# Patient Record
Sex: Male | Born: 1995 | Race: White | Hispanic: No | Marital: Single | Smoking: Never smoker
Health system: Southern US, Community
[De-identification: ages and names within clinical notes are randomized; demographics above are authoritative.]

## PROBLEM LIST (undated history)

## (undated) DIAGNOSIS — G43909 Migraine, unspecified, not intractable, without status migrainosus: Secondary | ICD-10-CM

## (undated) HISTORY — DX: Migraine, unspecified, not intractable, without status migrainosus: G43.909

## (undated) HISTORY — PX: NO PAST SURGERIES: SHX2092

---
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
## 2018-01-23 NOTE — Progress Notes (Signed)
[facility]    Provider:  Dr Lucia GaskinsAhern Referring Provider: Lindaann PascalLong, Scott, PA-C Primary Care Physician:  Lindaann PascalLong, Scott, PA-C  CC:  Migraines and headaches  HPI:  Travis Morales is a 22 y.o. male here as a referral from Dr. Jacqulyn BathLong for migraines. He has had headaches since middle school. He was taking meds for ADD, had problems with "emotions". Migraines worsened in HS. In the past 2 years they have been constant, "really really bad".  He has a lot of nose bleeds and this corresponds to the migraines and headaches. He does not take OTC medications, a BC powder every once in a while. Migraines are daily, he wakes up every morning with a headache (does not wake him up) behind the eyes with severe pain and on top of the head. Pulsating. When occipital he will have a nose bleed. Usually occipital and he wakes with a headache. Mother had migraines. Headaches last all day long untreated. BC can help but he doesn't take that often maybe one every 2-3 weeks. They are pulsating, pounding, throbbing, unilateral but can be either side, he has sound and light sensitivity, nausea, no vomiting. Can be severe 10/10 pain. Sleep helps, Worse in the morning. He hydrates well, no alcohol (triggers migraines), sleep helps. H barely sleep because of headache, nose bleeds. He says his home is not dry.  No other focal neurologic deficits, associated symptoms, inciting events or modifiable factors.   Reviewed notes, labs and imaging from outside physicians, which showed:   Reviewed labs January 18, 2018: INR is 1, CBC unremarkable, CMP unremarkable with BUN 14 and creatinine 0.95.  Reviewed urgent care notes.  Patient reported frequent severe headaches and migraines but no loss of consciousness, no weakness no numbness no dizziness.  No fever no night sweats, no meningismus.  Exam including neurologic and musculoskeletal were normal.  He was diagnosed with migraine without aura, not intractable, without  status migrainosus.  He also reported epistaxis and labs were drawn.  Review of Systems: Patient complains of symptoms per HPI as well as the following symptoms: headache, snoring. Pertinent negatives and positives per HPI. All others negative.   Social History   Socioeconomic History  . Marital status: Single    Spouse name: Not on file  . Number of children: Not on file  . Years of education: Not on file  . Highest education level: Not on file  Social Needs  . Financial resource strain: Not on file  . Food insecurity - worry: Not on file  . Food insecurity - inability: Not on file  . Transportation needs - medical: Not on file  . Transportation needs - non-medical: Not on file  Occupational History  . Not on file  Tobacco Use  . Smoking status: Never Smoker  . Smokeless tobacco: Never Used  Substance and Sexual Activity  . Alcohol use: No    Frequency: Never  . Drug use: No  . Sexual activity: Not on file  Other Topics Concern  . Not on file  Social History Narrative  . Not on file    Family History  Problem Relation Age of Onset  . Migraines Mother     Past Medical History:  Diagnosis Date  . Migraines     Past Surgical History:  Procedure Laterality Date  . NO PAST SURGERIES      Current Outpatient Medications  Medication Sig Dispense Refill  . nortriptyline (PAMELOR) 25 MG capsule Take 1 capsule (25 mg total) by  mouth at bedtime. 30 capsule 3   No current facility-administered medications for this visit.     Allergies as of 01/24/2018  . (No Known Allergies)    Vitals: BP 118/71   Pulse 88   Ht 5\' 9"  (1.753 m)   Wt 131 lb (59.4 kg)   BMI 19.35 kg/m  Last Weight:  Wt Readings from Last 1 Encounters:  01/24/18 131 lb (59.4 kg)   Last Height:   Ht Readings from Last 1 Encounters:  01/24/18 5\' 9"  (1.753 m)         Assessment/Plan:  22 year old with daily intractable headache. May be migraine disorder but need to rule out other causes  given his symptoms (as below).    Recurrent nose bleeds: ENT Has concerning symptoms of positional occipital headache, daily intractable headache needs MRI brain w/wo contrast to evaluate for space-occupying mass, lesion, tumo, chiari, pseudotumor cerebri or any other intracranial etiology Labs today Will consider Aimovig Start Nortriptyline for migraine prevention, may also help for insomnia  Cc: Scott Long  Orders Placed This Encounter  Procedures  . MR BRAIN W WO CONTRAST  . Comprehensive metabolic panel  . CBC  . TSH  . Ambulatory referral to ENT   Discussed: To prevent or relieve headaches, try the following: Cool Compress. Lie down and place a cool compress on your head.  Avoid headache triggers. If certain foods or odors seem to have triggered your migraines in the past, avoid them. A headache diary might help you identify triggers.  Include physical activity in your daily routine. Try a daily walk or other moderate aerobic exercise.  Manage stress. Find healthy ways to cope with the stressors, such as delegating tasks on your to-do list.  Practice relaxation techniques. Try deep breathing, yoga, massage and visualization.  Eat regularly. Eating regularly scheduled meals and maintaining a healthy diet might help prevent headaches. Also, drink plenty of fluids.  Follow a regular sleep schedule. Sleep deprivation might contribute to headaches Consider biofeedback. With this mind-body technique, you learn to control certain bodily functions - such as muscle tension, heart rate and blood pressure - to prevent headaches or reduce headache pain.    Proceed to emergency room if you experience new or worsening symptoms or symptoms do not resolve, if you have new neurologic symptoms or if headache is severe, or for any concerning symptom.   Provided education and documentation from American headache Society toolbox including articles on: chronic migraine medication overuse headache,  chronic migraines, prevention of migraines, behavioral and other nonpharmacologic treatments for headache.  Naomie Dean, MD  [facility] [address] [place], [place] [address]  Phone [phone] Fax [phone]

## 2018-01-24 ENCOUNTER — Ambulatory Visit: Payer: 59 | Admitting: Neurology

## 2018-01-24 ENCOUNTER — Encounter: Payer: Self-pay | Admitting: Neurology

## 2018-01-24 VITALS — BP 118/71 | HR 88 | Ht 69.0 in | Wt 131.0 lb

## 2018-01-24 DIAGNOSIS — R51 Headache with orthostatic component, not elsewhere classified: Secondary | ICD-10-CM

## 2018-01-24 DIAGNOSIS — G8929 Other chronic pain: Secondary | ICD-10-CM

## 2018-01-24 DIAGNOSIS — G43709 Chronic migraine without aura, not intractable, without status migrainosus: Secondary | ICD-10-CM

## 2018-01-24 DIAGNOSIS — R040 Epistaxis: Secondary | ICD-10-CM

## 2018-01-24 DIAGNOSIS — G43711 Chronic migraine without aura, intractable, with status migrainosus: Secondary | ICD-10-CM

## 2018-01-24 DIAGNOSIS — R519 Headache, unspecified: Secondary | ICD-10-CM

## 2018-01-24 MED ORDER — NORTRIPTYLINE HCL 25 MG PO CAPS: 25.0000 mg | ORAL_CAPSULE | Freq: Every day | ORAL | 3 refills | Status: DC

## 2018-01-24 NOTE — Patient Instructions (Addendum)
Recurrent nose bleeds: ENT (dr. Haroldine Lawsrossley) MRI brain w/wo contrast (will schedule) Labs today Will consider Aimovig - please research these new medications Start Nortriptyline at bedtime  Nortriptyline capsules What is this medicine? NORTRIPTYLINE (nor TRIP ti leen) is used to treat depression. This medicine may be used for other purposes; ask your health care provider or pharmacist if you have questions. COMMON BRAND NAME(S): Aventyl, Pamelor What should I tell my health care provider before I take this medicine? They need to know if you have any of these conditions: -an alcohol problem -bipolar disorder or schizophrenia -difficulty passing urine, prostate trouble -glaucoma -heart disease or recent heart attack -liver disease -over active thyroid -seizures -thoughts or plans of suicide or a previous suicide attempt or family history of suicide attempt -an unusual or allergic reaction to nortriptyline, other medicines, foods, dyes, or preservatives -pregnant or trying to get pregnant -breast-feeding How should I use this medicine? Take this medicine by mouth with a glass of water. Follow the directions on the prescription label. Take your doses at regular intervals. Do not take it more often than directed. Do not stop taking this medicine suddenly except upon the advice of your doctor. Stopping this medicine too quickly may cause serious side effects or your condition may worsen. A special MedGuide will be given to you by the pharmacist with each prescription and refill. Be sure to read this information carefully each time. Talk to your pediatrician regarding the use of this medicine in children. Special care may be needed. Overdosage: If you think you have taken too much of this medicine contact a poison control center or emergency room at once. NOTE: This medicine is only for you. Do not share this medicine with others. What if I miss a dose? If you miss a dose, take it as soon as  you can. If it is almost time for your next dose, take only that dose. Do not take double or extra doses. What may interact with this medicine? Do not take this medicine with any of the following medications: -arsenic trioxide -certain medicines medicines for irregular heart beat -cisapride -halofantrine -linezolid -MAOIs like Carbex, Eldepryl, Marplan, Nardil, and Parnate -methylene blue (injected into a vein) -other medicines for mental depression -phenothiazines like perphenazine, thioridazine and chlorpromazine -pimozide -probucol -procarbazine -sparfloxacin -St. John's Wort -ziprasidone This medicine may also interact with any of the following medications: -atropine and related drugs like hyoscyamine, scopolamine, tolterodine and others -barbiturate medicines for inducing sleep or treating seizures, such as phenobarbital -cimetidine -medicines for diabetes -medicines for seizures like carbamazepine or phenytoin -reserpine -thyroid medicine This list may not describe all possible interactions. Give your health care provider a list of all the medicines, herbs, non-prescription drugs, or dietary supplements you use. Also tell them if you smoke, drink alcohol, or use illegal drugs. Some items may interact with your medicine. What should I watch for while using this medicine? Tell your doctor if your symptoms do not get better or if they get worse. Visit your doctor or health care professional for regular checks on your progress. Because it may take several weeks to see the full effects of this medicine, it is important to continue your treatment as prescribed by your doctor. Patients and their families should watch out for new or worsening thoughts of suicide or depression. Also watch out for sudden changes in feelings such as feeling anxious, agitated, panicky, irritable, hostile, aggressive, impulsive, severely restless, overly excited and hyperactive, or not being able to sleep. If  this happens, especially at the beginning of treatment or after a change in dose, call your health care professional. Bonita Quin may get drowsy or dizzy. Do not drive, use machinery, or do anything that needs mental alertness until you know how this medicine affects you. Do not stand or sit up quickly, especially if you are an older patient. This reduces the risk of dizzy or fainting spells. Alcohol may interfere with the effect of this medicine. Avoid alcoholic drinks. Do not treat yourself for coughs, colds, or allergies without asking your doctor or health care professional for advice. Some ingredients can increase possible side effects. Your mouth may get dry. Chewing sugarless gum or sucking hard candy, and drinking plenty of water may help. Contact your doctor if the problem does not go away or is severe. This medicine may cause dry eyes and blurred vision. If you wear contact lenses you may feel some discomfort. Lubricating drops may help. See your eye doctor if the problem does not go away or is severe. This medicine can cause constipation. Try to have a bowel movement at least every 2 to 3 days. If you do not have a bowel movement for 3 days, call your doctor or health care professional. This medicine can make you more sensitive to the sun. Keep out of the sun. If you cannot avoid being in the sun, wear protective clothing and use sunscreen. Do not use sun lamps or tanning beds/booths. What side effects may I notice from receiving this medicine? Side effects that you should report to your doctor or health care professional as soon as possible: -allergic reactions like skin rash, itching or hives, swelling of the face, lips, or tongue -anxious -breathing problems -changes in vision -confusion -elevated mood, decreased need for sleep, racing thoughts, impulsive behavior -eye pain -fast, irregular heartbeat -feeling faint or lightheaded, falls -feeling agitated, angry, or irritable -fever with  increased sweating -hallucination, loss of contact with reality -seizures -stiff muscles -suicidal thoughts or other mood changes -tingling, pain, or numbness in the feet or hands -trouble passing urine or change in the amount of urine -trouble sleeping -unusually weak or tired -vomiting -yellowing of the eyes or skin Side effects that usually do not require medical attention (report to your doctor or health care professional if they continue or are bothersome): -change in sex drive or performance -change in appetite or weight -constipation -dizziness -dry mouth -nausea -tired -tremors -upset stomach This list may not describe all possible side effects. Call your doctor for medical advice about side effects. You may report side effects to FDA at 1-800-FDA-1088. Where should I keep my medicine? Keep out of the reach of children. Store at room temperature between 15 and 30 degrees C (59 and 86 degrees F). Keep container tightly closed. Throw away any unused medicine after the expiration date. NOTE: This sheet is a summary. It may not cover all possible information. If you have questions about this medicine, talk to your doctor, pharmacist, or health care provider.  2018 Elsevier/Gold Standard (2016-04-20 12:53:08)  Beckie Busing: Patient drug information Access [employer] Online here. Copyright [phone] [employer] All rights reserved. (For additional information see "Erenumab: Drug information") Brand Names: [place]  Aimovig;  Aimovig 140 Dose  What is this drug used for?   It is used to prevent migraine headaches.  What do I need to tell my doctor BEFORE I take this drug?   If you have an allergy to this drug or any part of this drug.  If you are allergic to any drugs like this one, any other drugs, foods, or other substances. Tell your doctor about the allergy and what signs you had, like rash; hives; itching; shortness of breath; wheezing; cough; swelling of face, lips, tongue,  or throat; or any other signs.   This drug may interact with other drugs or health problems.   Tell your doctor and pharmacist about all of your drugs (prescription or OTC, natural products, vitamins) and health problems. You must check to make sure that it is safe for you to take this drug with all of your drugs and health problems. Do not start, stop, or change the dose of any drug without checking with your doctor.  What are some things I need to know or do while I take this drug?   Tell all of your health care providers that you take this drug. This includes your doctors, nurses, pharmacists, and dentists.   If you have a latex allergy, talk with your doctor.   Tell your doctor if you are pregnant or plan on getting pregnant. You will need to talk about the benefits and risks of using this drug while you are pregnant.   Tell your doctor if you are breast-feeding. You will need to talk about any risks to your baby.  What are some side effects that I need to call my doctor about right away?   WARNING/CAUTION: Even though it may be rare, some people may have very bad and sometimes deadly side effects when taking a drug. Tell your doctor or get medical help right away if you have any of the following signs or symptoms that may be related to a very bad side effect:   Signs of an allergic reaction, like rash; hives; itching; red, swollen, blistered, or peeling skin with or without fever; wheezing; tightness in the chest or throat; trouble breathing, swallowing, or talking; unusual hoarseness; or swelling of the mouth, face, lips, tongue, or throat.  What are some other side effects of this drug?   All drugs may cause side effects. However, many people have no side effects or only have minor side effects. Call your doctor or get medical help if any of these side effects or any other side effects bother you or do not go away:   Redness or swelling where the shot is given.   Pain where the shot was  given.   Constipation.   These are not all of the side effects that may occur. If you have questions about side effects, call your doctor. Call your doctor for medical advice about side effects.   You may report side effects to your national health agency.  How is this drug best taken?   Use this drug as ordered by your doctor. Read all information given to you. Follow all instructions closely.   It is given as a shot into the fatty part of the skin on the top of the thigh, belly area, or upper arm.   If you will be giving yourself the shot, your doctor or nurse will teach you how to give the shot.   Follow how to use as you have been told by the doctor or read the package insert.   If stored in a refrigerator, let this drug come to room temperature before using it. Leave it at room temperature for at least 30 minutes. Do not heat this drug.   Protect from heat and sunlight.   Do not shake.  Do not give into skin that is irritated, bruised, red, infected, or scarred.   Do not use if the solution is cloudy, leaking, or has particles.   Do not use if solution changes color.   Throw away after using. Do not use the device more than 1 time.   Throw away needles in a needle/sharp disposal box. Do not reuse needles or other items. When the box is full, follow all local rules for getting rid of it. Talk with a doctor or pharmacist if you have any questions.  What do I do if I miss a dose?   Take a missed dose as soon as you think about it.   After taking a missed dose, start a new schedule based on when the dose is taken.  How do I store and/or throw out this drug?   Store in a refrigerator. Do not freeze.   Store in the carton to protect from light.   Do not use if it has been frozen.   If you drop this drug on a hard surface, do not use it.   If needed, you may store at room temperature for up to 7 days. Write down the date you take this drug out of the refrigerator. If stored at room  temperature and not used within 7 days, throw this drug away.   Do not put this drug back in the refrigerator after it has been stored at room temperature.   Keep all drugs in a safe place. Keep all drugs out of the reach of children and pets.   Throw away unused or expired drugs. Do not flush down a toilet or pour down a drain unless you are told to do so. Check with your pharmacist if you have questions about the best way to throw out drugs. There may be drug take-back programs in your area.  General drug facts   If your symptoms or health problems do not get better or if they become worse, call your doctor.   Do not share your drugs with others and do not take anyone else's drugs.   Keep a list of all your drugs (prescription, natural products, vitamins, OTC) with you. Give this list to your doctor.   Talk with the doctor before starting any new drug, including prescription or OTC, natural products, or vitamins.   Some drugs may have another patient information leaflet. If you have any questions about this drug, please talk with your doctor, nurse, pharmacist, or other health care provider.   If you think there has been an overdose, call your poison control center or get medical care right away. Be ready to tell or show what was taken, how much, and when it happened.

## 2018-01-25 LAB — COMPREHENSIVE METABOLIC PANEL
ALT: 23 IU/L (ref 0–44)
AST: 29 IU/L (ref 0–40)
Albumin/Globulin Ratio: 2.5 — ABNORMAL HIGH (ref 1.2–2.2)
Albumin: 5 g/dL (ref 3.5–5.5)
Alkaline Phosphatase: 73 IU/L (ref 39–117)
BUN/Creatinine Ratio: 9 (ref 9–20)
BUN: 9 mg/dL (ref 6–20)
Bilirubin Total: 0.6 mg/dL (ref 0.0–1.2)
CO2: 22 mmol/L (ref 20–29)
Calcium: 9.8 mg/dL (ref 8.7–10.2)
Chloride: 102 mmol/L (ref 96–106)
Creatinine, Ser: 0.98 mg/dL (ref 0.76–1.27)
GFR calc Af Amer: 124 mL/min/{1.73_m2} (ref >59)
GFR calc non Af Amer: 107 mL/min/{1.73_m2} (ref >59)
Globulin, Total: 2 g/dL (ref 1.5–4.5)
Glucose: 89 mg/dL (ref 65–99)
Potassium: 4.8 mmol/L (ref 3.5–5.2)
Sodium: 139 mmol/L (ref 134–144)
Total Protein: 7 g/dL (ref 6.0–8.5)

## 2018-01-25 LAB — TSH: TSH: 2.27 u[IU]/mL (ref 0.450–4.500)

## 2018-01-25 LAB — CBC
Hematocrit: 44.9 % (ref 37.5–51.0)
Hemoglobin: 15.1 g/dL (ref 13.0–17.7)
MCH: 29 pg (ref 26.6–33.0)
MCHC: 33.6 g/dL (ref 31.5–35.7)
MCV: 86 fL (ref 79–97)
Platelets: 234 10*3/uL (ref 150–379)
RBC: 5.21 x10E6/uL (ref 4.14–5.80)
RDW: 14.3 % (ref 12.3–15.4)
WBC: 4.3 10*3/uL (ref 3.4–10.8)

## 2018-01-27 ENCOUNTER — Encounter: Payer: Self-pay | Admitting: Neurology

## 2018-01-27 ENCOUNTER — Telehealth: Payer: Self-pay | Admitting: *Deleted

## 2018-01-27 ENCOUNTER — Telehealth: Payer: Self-pay | Admitting: Neurology

## 2018-01-27 DIAGNOSIS — G43709 Chronic migraine without aura, not intractable, without status migrainosus: Secondary | ICD-10-CM | POA: Insufficient documentation

## 2018-01-27 NOTE — Telephone Encounter (Addendum)
Called pt & LVM (ok per DPR) informing him that his labs look fine per Dr. Lucia GaskinsAhern. Stated that call back is not required but encouraged pt to call with any questions. Left office number & hours in message.   ----- Message from Anson FretAntonia B Ahern, MD sent at 01/25/2018  2:13 PM EST ----- Labs look fine thanks

## 2018-01-27 NOTE — Telephone Encounter (Signed)
Patient calling to cancel his appointment for an MRI for tomorrow and does not want to reschedule. He can be reached at 3:50pm today.if you need to call him.

## 2018-01-27 NOTE — Telephone Encounter (Signed)
Noted, I went ahead and cancel his appointment.

## 2018-01-28 ENCOUNTER — Other Ambulatory Visit: Payer: 59

## 2018-03-19 ENCOUNTER — Ambulatory Visit: Payer: 59 | Admitting: Neurology

## 2018-09-13 ENCOUNTER — Emergency Department (HOSPITAL_COMMUNITY): Payer: 59

## 2018-09-13 ENCOUNTER — Other Ambulatory Visit: Payer: Self-pay

## 2018-09-13 ENCOUNTER — Encounter (HOSPITAL_COMMUNITY): Payer: Self-pay | Admitting: Emergency Medicine

## 2018-09-13 ENCOUNTER — Inpatient Hospital Stay (HOSPITAL_COMMUNITY)
Admission: EM | Admit: 2018-09-13 | Discharge: 2018-09-19 | DRG: 165 | Disposition: A | Discharge status: Home / Self Care | Payer: 59 | Attending: Cardiothoracic Surgery | Admitting: Cardiothoracic Surgery

## 2018-09-13 ENCOUNTER — Inpatient Hospital Stay (HOSPITAL_COMMUNITY): Payer: 59

## 2018-09-13 DIAGNOSIS — R0603 Acute respiratory distress: Secondary | ICD-10-CM | POA: Diagnosis present

## 2018-09-13 DIAGNOSIS — J939 Pneumothorax, unspecified: Secondary | ICD-10-CM | POA: Diagnosis not present

## 2018-09-13 DIAGNOSIS — J9382 Other air leak: Secondary | ICD-10-CM | POA: Diagnosis present

## 2018-09-13 DIAGNOSIS — Z9889 Other specified postprocedural states: Secondary | ICD-10-CM

## 2018-09-13 DIAGNOSIS — Z4682 Encounter for fitting and adjustment of non-vascular catheter: Secondary | ICD-10-CM

## 2018-09-13 DIAGNOSIS — Z9689 Presence of other specified functional implants: Secondary | ICD-10-CM | POA: Diagnosis not present

## 2018-09-13 DIAGNOSIS — J9383 Other pneumothorax: Secondary | ICD-10-CM | POA: Diagnosis present

## 2018-09-13 DIAGNOSIS — J439 Emphysema, unspecified: Secondary | ICD-10-CM | POA: Diagnosis present

## 2018-09-13 DIAGNOSIS — Z09 Encounter for follow-up examination after completed treatment for conditions other than malignant neoplasm: Secondary | ICD-10-CM

## 2018-09-13 DIAGNOSIS — R0602 Shortness of breath: Secondary | ICD-10-CM | POA: Diagnosis present

## 2018-09-13 DIAGNOSIS — Z79899 Other long term (current) drug therapy: Secondary | ICD-10-CM | POA: Diagnosis not present

## 2018-09-13 DIAGNOSIS — J930 Spontaneous tension pneumothorax: Principal | ICD-10-CM

## 2018-09-13 DIAGNOSIS — J9311 Primary spontaneous pneumothorax: Secondary | ICD-10-CM | POA: Diagnosis not present

## 2018-09-13 DIAGNOSIS — R0781 Pleurodynia: Secondary | ICD-10-CM | POA: Diagnosis not present

## 2018-09-13 LAB — CBC WITH DIFFERENTIAL/PLATELET
Abs Immature Granulocytes: 0.01 10*3/uL (ref 0.00–0.07)
BASOS ABS: 0 10*3/uL (ref 0.0–0.1)
Basophils Relative: 1 %
EOS ABS: 0.1 10*3/uL (ref 0.0–0.5)
EOS PCT: 2 %
HEMATOCRIT: 47.3 % (ref 39.0–52.0)
HEMOGLOBIN: 15.5 g/dL (ref 13.0–17.0)
Immature Granulocytes: 0 %
LYMPHS PCT: 43 %
Lymphs Abs: 1.5 10*3/uL (ref 0.7–4.0)
MCH: 28 pg (ref 26.0–34.0)
MCHC: 32.8 g/dL (ref 30.0–36.0)
MCV: 85.5 fL (ref 80.0–100.0)
MONO ABS: 0.4 10*3/uL (ref 0.1–1.0)
Monocytes Relative: 10 %
NRBC: 0 % (ref 0.0–0.2)
Neutro Abs: 1.5 10*3/uL — ABNORMAL LOW (ref 1.7–7.7)
Neutrophils Relative %: 44 %
Platelets: 227 10*3/uL (ref 150–400)
RBC: 5.53 MIL/uL (ref 4.22–5.81)
RDW: 12.1 % (ref 11.5–15.5)
WBC: 3.5 10*3/uL — AB (ref 4.0–10.5)

## 2018-09-13 LAB — BASIC METABOLIC PANEL
Anion gap: 8 (ref 5–15)
BUN: 14 mg/dL (ref 6–20)
CALCIUM: 9.7 mg/dL (ref 8.9–10.3)
CHLORIDE: 109 mmol/L (ref 98–111)
CO2: 27 mmol/L (ref 22–32)
CREATININE: 1.13 mg/dL (ref 0.61–1.24)
GFR calc Af Amer: >60 mL/min (ref >60)
GFR calc non Af Amer: >60 mL/min (ref >60)
Glucose, Bld: 125 mg/dL — ABNORMAL HIGH (ref 70–99)
Potassium: 3.8 mmol/L (ref 3.5–5.1)
SODIUM: 144 mmol/L (ref 135–145)

## 2018-09-13 LAB — RAPID HIV SCREEN (HIV 1/2 AB+AG)
HIV 1/2 Antibodies: NONREACTIVE
HIV-1 P24 Antigen - HIV24: NONREACTIVE

## 2018-09-13 LAB — GLUCOSE, CAPILLARY: GLUCOSE-CAPILLARY: 94 mg/dL (ref 70–99)

## 2018-09-13 LAB — D-DIMER, QUANTITATIVE: D-Dimer, Quant: <0.27 ug/mL-FEU (ref 0.00–0.50)

## 2018-09-13 LAB — TROPONIN I: Troponin I: <0.03 ng/mL (ref <0.03)

## 2018-09-13 MED ORDER — ONDANSETRON HCL 4 MG/2ML IJ SOLN
4.0000 mg | Freq: Four times a day (QID) | INTRAMUSCULAR | Status: DC | PRN
  Administered 2018-09-13: 4 mg via INTRAVENOUS
  Filled 2018-09-13: qty 2

## 2018-09-13 MED ORDER — ONDANSETRON HCL 4 MG PO TABS: 4.0000 mg | ORAL_TABLET | Freq: Four times a day (QID) | ORAL | Status: DC | PRN

## 2018-09-13 MED ORDER — ACETAMINOPHEN 325 MG PO TABS
650.0000 mg | ORAL_TABLET | Freq: Four times a day (QID) | ORAL | Status: DC | PRN
  Administered 2018-09-13 – 2018-09-14 (×3): 650 mg via ORAL
  Filled 2018-09-13 (×4): qty 2

## 2018-09-13 MED ORDER — TRAMADOL HCL 50 MG PO TABS: 50.0000 mg | ORAL_TABLET | Freq: Once | ORAL | Status: DC

## 2018-09-13 MED ORDER — HEPARIN SODIUM (PORCINE) 5000 UNIT/ML IJ SOLN
5000.0000 [IU] | Freq: Three times a day (TID) | INTRAMUSCULAR | Status: DC
  Administered 2018-09-13 – 2018-09-15 (×7): 5000 [IU] via SUBCUTANEOUS
  Filled 2018-09-13 (×7): qty 1

## 2018-09-13 MED ORDER — ACETAMINOPHEN 650 MG RE SUPP: 650.0000 mg | Freq: Four times a day (QID) | RECTAL | Status: DC | PRN

## 2018-09-13 MED ORDER — SODIUM CHLORIDE 0.9% FLUSH
3.0000 mL | Freq: Two times a day (BID) | INTRAVENOUS | Status: DC
  Administered 2018-09-13 – 2018-09-16 (×6): 3 mL via INTRAVENOUS

## 2018-09-13 MED ORDER — POLYETHYLENE GLYCOL 3350 17 G PO PACK: 17.0000 g | PACK | Freq: Every day | ORAL | Status: DC | PRN

## 2018-09-13 MED ORDER — SODIUM CHLORIDE 0.9% FLUSH: 3.0000 mL | INTRAVENOUS | Status: DC | PRN

## 2018-09-13 MED ORDER — HYDROMORPHONE HCL 1 MG/ML IJ SOLN
1.0000 mg | Freq: Once | INTRAMUSCULAR | Status: AC
  Administered 2018-09-13: 1 mg via INTRAVENOUS
  Filled 2018-09-13: qty 1

## 2018-09-13 MED ORDER — SODIUM CHLORIDE 0.9 % IV SOLN: 250.0000 mL | INTRAVENOUS | Status: DC | PRN

## 2018-09-13 MED ORDER — HYDROMORPHONE HCL 1 MG/ML IJ SOLN
INTRAMUSCULAR | Status: AC
  Administered 2018-09-13: 1 mg
  Filled 2018-09-13: qty 2

## 2018-09-13 MED ORDER — MORPHINE SULFATE (PF) 4 MG/ML IV SOLN
4.0000 mg | Freq: Once | INTRAVENOUS | Status: AC
  Administered 2018-09-13: 4 mg via INTRAVENOUS
  Filled 2018-09-13: qty 1

## 2018-09-13 MED ORDER — KETOROLAC TROMETHAMINE 30 MG/ML IJ SOLN
30.0000 mg | Freq: Four times a day (QID) | INTRAMUSCULAR | Status: AC
  Administered 2018-09-13 – 2018-09-15 (×8): 30 mg via INTRAVENOUS
  Filled 2018-09-13 (×8): qty 1

## 2018-09-13 MED ORDER — MIDAZOLAM HCL 2 MG/2ML IJ SOLN
INTRAMUSCULAR | Status: AC
  Administered 2018-09-13: 1 mg
  Filled 2018-09-13: qty 2

## 2018-09-13 MED ORDER — HYDROMORPHONE HCL 1 MG/ML IJ SOLN
1.0000 mg | INTRAMUSCULAR | Status: DC | PRN
  Administered 2018-09-13 – 2018-09-15 (×2): 1 mg via INTRAVENOUS
  Filled 2018-09-13 (×2): qty 1

## 2018-09-13 MED ORDER — MIDAZOLAM HCL 2 MG/2ML IJ SOLN
INTRAMUSCULAR | Status: AC
  Administered 2018-09-13: 2 mg
  Filled 2018-09-13: qty 2

## 2018-09-13 NOTE — Progress Notes (Signed)
This RN called Lovell Sheehan, MD to clarify chest tube suction order. MD stated to have the patient hooked to water seal canister set to -20cm in addition to medium/high suction. MD instructions carried out by this RN. Will continue to monitor.  Quita Skye, RN

## 2018-09-13 NOTE — ED Provider Notes (Signed)
[facility] EMERGENCY DEPARTMENT Provider Note   CSN: [id number] Arrival date & time: 09/13/18  1191     History   Chief Complaint Chief Complaint  Patient presents with  . Shortness of Breath    HPI Travis Morales is a 22 y.o. male.  Level 5 caveat for difficulty breathing.  Patient awoke from sleep about 5 AM with shortness of breath and chest pain on the left side that radiates to his back.  EMS reports unable to hear breath sounds in the left.  Initial O2 sat 92%.  Patient denies any history of asthma or smoking.  Felt well when he went to bed.  Denies any chronic medical conditions or medication use.  Denies any cough or fever.  No leg pain or leg swelling. He is never had a pneumothorax before.  The history is provided by the EMS personnel and the patient. The history is limited by the condition of the patient.  Shortness of Breath  Associated symptoms include cough. Pertinent negatives include no fever, no headaches, no chest pain, no vomiting, no abdominal pain and no rash.    Past Medical History:  Diagnosis Date  . Migraines     Patient Active Problem List   Diagnosis Date Noted  . Chronic migraine without aura without status migrainosus, not intractable 01/27/2018    Past Surgical History:  Procedure Laterality Date  . NO PAST SURGERIES          Home Medications    Prior to Admission medications   Medication Sig Start Date End Date Taking? Authorizing Provider  nortriptyline (PAMELOR) 25 MG capsule Take 1 capsule (25 mg total) by mouth at bedtime. 01/24/18   Anson Fret, MD    Family History Family History  Problem Relation Age of Onset  . Migraines Mother     Social History Social History   Tobacco Use  . Smoking status: Never Smoker  . Smokeless tobacco: Never Used  Substance Use Topics  . Alcohol use: No    Frequency: Never  . Drug use: No     Allergies   Patient has no known allergies.   Review of Systems Review of  Systems  Constitutional: Negative for activity change, appetite change and fever.  HENT: Negative for congestion.   Respiratory: Positive for cough, chest tightness and shortness of breath.   Cardiovascular: Negative for chest pain.  Gastrointestinal: Negative for abdominal pain, nausea and vomiting.  Genitourinary: Negative for dysuria, hematuria, testicular pain and urgency.  Musculoskeletal: Negative for arthralgias and myalgias.  Skin: Negative for rash.  Neurological: Negative for dizziness, weakness, light-headedness and headaches.   all other systems are negative except as noted in the HPI and PMH.     Physical Exam Updated Vital Signs Ht 5\' 9"  (1.753 m)   Wt 63.5 kg   BMI 20.67 kg/m   Physical Exam  Constitutional: He is oriented to person, place, and time. He appears well-developed and well-nourished. He appears distressed.  Anxious, increased work of breathing, tachypneic  HENT:  Head: Normocephalic and atraumatic.  Mouth/Throat: Oropharynx is clear and moist. No oropharyngeal exudate.  Eyes: Pupils are equal, round, and reactive to light. Conjunctivae and EOM are normal.  Neck: Normal range of motion. Neck supple.  No meningismus.  Cardiovascular: Normal rate, normal heart sounds and intact distal pulses.  No murmur heard. Tachycardic to the 120s  Pulmonary/Chest: He is in respiratory distress.  Diminished breath sounds on the left  Abdominal: Soft. There is no  tenderness. There is no rebound and no guarding.  Musculoskeletal: Normal range of motion. He exhibits no edema or tenderness.  Neurological: He is alert and oriented to person, place, and time. No cranial nerve deficit. He exhibits normal muscle tone. Coordination normal.  No ataxia on finger to nose bilaterally. No pronator drift. 5/5 strength throughout. CN 2-12 intact.Equal grip strength. Sensation intact.   Skin: Skin is warm.  Psychiatric: He has a normal mood and affect. His behavior is normal.    Nursing note and vitals reviewed.    ED Treatments / Results  Labs (all labs ordered are listed, but only abnormal results are displayed) Labs Reviewed  CBC WITH DIFFERENTIAL/PLATELET - Abnormal; Notable for the following components:      Result Value   WBC 3.5 (*)    Neutro Abs 1.5 (*)    All other components within normal limits  BASIC METABOLIC PANEL - Abnormal; Notable for the following components:   Glucose, Bld 125 (*)    All other components within normal limits  TROPONIN I  D-DIMER, QUANTITATIVE (NOT AT [address] [facility])  RAPID HIV SCREEN (HIV 1/2 AB+AG)    EKG EKG Interpretation  Date/Time:  Saturday September 13 2018 06:29:56 EDT Ventricular Rate:  101 PR Interval:    QRS Duration: 98 QT Interval:  321 QTC Calculation: 416 R Axis:   84 Text Interpretation:  Sinus tachycardia Borderline T wave abnormalities No previous ECGs available Confirmed by Glynn Octave [phone]) on 09/13/2018 7:07:51 AM   Radiology Dg Chest Portable 1 View  Result Date: 09/13/2018 CLINICAL DATA:  Short of breath EXAM: PORTABLE CHEST 1 VIEW COMPARISON:  None. FINDINGS: Large left-sided pneumothorax. Near complete collapse of the left lung. Mild mediastinal shift to the right suggesting mild tension. No effusion Right lung is clear IMPRESSION: Near complete pneumothorax on the left with mild tension. These results were called by telephone at the time of interpretation on 09/13/2018 at 7:09 am to Dr. Glynn Octave , who verbally acknowledged these results. Electronically Signed   By: Marlan Palau M.D.   On: 09/13/2018 07:09    Procedures CHEST TUBE INSERTION Date/Time: 09/13/2018 7:15 AM Performed by: Glynn Octave, MD Authorized by: Glynn Octave, MD   Consent:    Consent obtained:  Emergent situation and verbal   Consent given by:  Patient   Risks discussed:  Bleeding, damage to surrounding structures, infection, incomplete drainage, nerve damage and pain   Alternatives discussed:  No  treatment Pre-procedure details:    Skin preparation:  ChloraPrep   Preparation: Patient was prepped and draped in the usual sterile fashion   Sedation:    Sedation type:  Deep Anesthesia (see MAR for exact dosages):    Anesthesia method:  Local infiltration   Local anesthetic:  Lidocaine 2% WITH epi Procedure details:    Placement location:  L lateral   Scalpel size:  11   Tube size (Fr):  Minicatheter   Ultrasound guidance: no     Tension pneumothorax: yes     Tube connected to:  Water seal and suction   Drainage characteristics:  Air only   Suture material:  0 silk   Dressing:  4x4 sterile gauze Post-procedure details:    Post-insertion x-ray findings: tube in good position     Patient tolerance of procedure:  Tolerated well, no immediate complications  .Sedation Date/Time: 09/13/2018 7:49 AM Performed by: Glynn Octave, MD Authorized by: Glynn Octave, MD   Consent:    Consent obtained:  Verbal and emergent  situation   Consent given by:  Patient   Risks discussed:  Allergic reaction, inadequate sedation, prolonged hypoxia resulting in organ damage and respiratory compromise necessitating ventilatory assistance and intubation   Alternatives discussed:  Anxiolysis Universal protocol:    Immediately prior to procedure a time out was called: yes   Indications:    Procedure performed:  Chest tube placement   Procedure necessitating sedation performed by:  Physician performing sedation Pre-sedation assessment:    Time since last food or drink:  6   NPO status caution: unable to specify NPO status     ASA classification: class 1 - normal, healthy patient     Neck mobility: normal     Mouth opening:  3 or more finger widths   Thyromental distance:  4 finger widths   Mallampati score:  I - soft palate, uvula, fauces, pillars visible   Pre-sedation assessments completed and reviewed: airway patency, cardiovascular function, mental status and nausea/vomiting      Pre-sedation assessment completed:  09/13/2018 6:30 AM Immediate pre-procedure details:    Reassessment: Patient reassessed immediately prior to procedure     Reviewed: vital signs, relevant labs/tests and NPO status     Verified: bag valve mask available, emergency equipment available, intubation equipment available, IV patency confirmed, oxygen available and suction available   Procedure details (see MAR for exact dosages):    Preoxygenation:  Nasal cannula   Sedation:  Midazolam   Analgesia:  Hydromorphone   Intra-procedure monitoring:  Blood pressure monitoring, cardiac monitor, continuous pulse oximetry, frequent vital sign checks and frequent LOC assessments   Intra-procedure events: none     Total Provider sedation time (minutes):  15 Post-procedure details:    Post-sedation assessment completed:  09/13/2018 6:45 AM   Attendance: Constant attendance by certified staff until patient recovered     Recovery: Patient returned to pre-procedure baseline     Post-sedation assessments completed and reviewed: airway patency, cardiovascular function, mental status and pain level     Patient is stable for discharge or admission: yes     Patient tolerance:  Tolerated well, no immediate complications   (including critical care time)  Medications Ordered in ED Medications  HYDROmorphone (DILAUDID) injection 1 mg (1 mg Intravenous Given 09/13/18 0634)  midazolam (VERSED) 2 MG/2ML injection (2 mg  Given 09/13/18 0981)  HYDROmorphone (DILAUDID) 1 MG/ML injection (1 mg  Given 09/13/18 0645)  midazolam (VERSED) 2 MG/2ML injection (1 mg  Given 09/13/18 1914)     Initial Impression / Assessment and Plan / ED Course  I have reviewed the triage vital signs and the nursing notes.  Pertinent labs & imaging results that were available during my care of the patient were reviewed by me and considered in my medical decision making (see chart for details).    Patient with sudden onset of chest pain or  shortness of breath, absent breath sounds on the left.  Concern for pneumothorax clinically.  Patient given pain control, portable chest x-ray, labs.  Placed on supplemental oxygen.  Chest x-ray confirms large left pneumothorax with mild component of tension. Emergent chest tube placed with verbal permission from the patient. IV sedation with dilaudid and midazolam provided.  Resolution of pneumothorax with chest tube placement as above.  Patient much more comfortable. Labs are reassuring with negative troponin and negative d-dimer.  Discussed with Dr. Lovell Sheehan of general surgery who will manage chest tube at request hospitalist admission. D/w Dr. Kerry Hough.  CRITICAL CARE Performed by: Glynn Octave Total critical  care time: 35 minutes Critical care time was exclusive of separately billable procedures and treating other patients. Critical care was necessary to treat or prevent imminent or life-threatening deterioration. Critical care was time spent personally by me on the following activities: development of treatment plan with patient and/or surrogate as well as nursing, discussions with consultants, evaluation of patient's response to treatment, examination of patient, obtaining history from patient or surrogate, ordering and performing treatments and interventions, ordering and review of laboratory studies, ordering and review of radiographic studies, pulse oximetry and re-evaluation of patient's condition.   Final Clinical Impressions(s) / ED Diagnoses   Final diagnoses:  Pneumothorax, spontaneous, tension    ED Discharge Orders    None       Aadvika Konen, Jeannett Senior, MD 09/13/18 [phone]

## 2018-09-13 NOTE — ED Triage Notes (Signed)
Per [facility], called out for difficulty breathing. When arrived pt c/o mid chest pain that radiated to back.

## 2018-09-13 NOTE — H&P (Addendum)
History and Physical    Travis Morales:[id number] DOB: 1996/06/28 DOA: 09/13/2018  PCP: Lindaann Pascal, PA-C  Patient coming from: home  I have personally briefly reviewed patient's old medical records in [facility]  Chief Complaint: shortness of breath  HPI: Travis Morales is a 22 y.o. male with no significant past medical history who is not on any chronic medications.  Patient reported waking up at 5 AM today with worsening shortness of breath and chest pain.  Chest pain was described left chest and radiated to his left upper back.  Shortness of breath was worse when he was laying down.  He did not have any fever or cough.  Patient attempted to get up and move around to see if this would help his symptoms, but they continue to progress.  EMS was called and he was brought to the hospital for evaluation.  Patient denies any history of smoking or vaping.  ED Course: He was evaluated in the emergency room where he was noted to be in respiratory distress.  Chest x-ray confirmed left-sided pneumothorax with tension.  Chest tube was placed in the emergency room.  Basic labs were noted to be unrevealing.  He is being referred for admission.  Review of Systems: As per HPI otherwise 10 point review of systems negative.    Past Medical History:  Diagnosis Date  . Migraines     Past Surgical History:  Procedure Laterality Date  . NO PAST SURGERIES      Social History:  reports that he has never smoked. He has never used smokeless tobacco. He reports that he does not drink alcohol or use drugs.  No Known Allergies  Family History  Problem Relation Age of Onset  . Migraines Mother     Prior to Admission medications   Medication Sig Start Date End Date Taking? Authorizing Provider  nortriptyline (PAMELOR) 25 MG capsule Take 1 capsule (25 mg total) by mouth at bedtime. 01/24/18   Anson Fret, MD    Physical Exam: Vitals:   09/13/18 0715 09/13/18 0730 09/13/18  0800 09/13/18 0833  BP:  120/75 116/74 101/70  Pulse: 81 70 74 82  Resp: (!) 22 11 14 20   SpO2: 100% 100% 100% 99%  Weight:      Height:        Constitutional: NAD, calm, comfortable Eyes: PERRL, lids and conjunctivae normal ENMT: Mucous membranes are moist. Posterior pharynx clear of any exudate or lesions.Normal dentition.  Neck: normal, supple, no masses, no thyromegaly Respiratory: clear to auscultation bilaterally, no wheezing, no crackles. Normal respiratory effort. No accessory muscle use. Chest tube in left posterior chest Cardiovascular: Regular rate and rhythm, no murmurs / rubs / gallops. No extremity edema. 2+ pedal pulses. No carotid bruits.  Abdomen: no tenderness, no masses palpated. No hepatosplenomegaly. Bowel sounds positive.  Musculoskeletal: no clubbing / cyanosis. No joint deformity upper and lower extremities. Good ROM, no contractures. Normal muscle tone.  Skin: no rashes, lesions, ulcers. No induration Neurologic: CN 2-12 grossly intact. Sensation intact, DTR normal. Strength 5/5 in all 4.  Psychiatric: Normal judgment and insight. Alert and oriented x 3. Normal mood.    Labs on Admission: I have personally reviewed following labs and imaging studies  CBC: Recent Labs  Lab 09/13/18 0600  WBC 3.5*  NEUTROABS 1.5*  HGB 15.5  HCT 47.3  MCV 85.5  PLT 227   Basic Metabolic Panel: Recent Labs  Lab 09/13/18 0600  NA 144  K  3.8  CL 109  CO2 27  GLUCOSE 125*  BUN 14  CREATININE 1.13  CALCIUM 9.7   GFR: Estimated Creatinine Clearance: 90.5 mL/min (by C-G formula based on SCr of 1.13 mg/dL). Liver Function Tests: No results for input(s): AST, ALT, ALKPHOS, BILITOT, PROT, ALBUMIN in the last 168 hours. No results for input(s): LIPASE, AMYLASE in the last 168 hours. No results for input(s): AMMONIA in the last 168 hours. Coagulation Profile: No results for input(s): INR, PROTIME in the last 168 hours. Cardiac Enzymes: Recent Labs  Lab  09/13/18 0600  TROPONINI <0.03   BNP (last 3 results) No results for input(s): PROBNP in the last 8760 hours. HbA1C: No results for input(s): HGBA1C in the last 72 hours. CBG: No results for input(s): GLUCAP in the last 168 hours. Lipid Profile: No results for input(s): CHOL, HDL, LDLCALC, TRIG, CHOLHDL, LDLDIRECT in the last 72 hours. Thyroid Function Tests: No results for input(s): TSH, T4TOTAL, FREET4, T3FREE, THYROIDAB in the last 72 hours. Anemia Panel: No results for input(s): VITAMINB12, FOLATE, FERRITIN, TIBC, IRON, RETICCTPCT in the last 72 hours. Urine analysis: No results found for: COLORURINE, APPEARANCEUR, LABSPEC, PHURINE, GLUCOSEU, HGBUR, BILIRUBINUR, KETONESUR, PROTEINUR, UROBILINOGEN, NITRITE, LEUKOCYTESUR  Radiological Exams on Admission: Dg Chest 1v Repeat Same Day  Result Date: 09/13/2018 CLINICAL DATA:  Status post left chest tube placement EXAM: CHEST - 1 VIEW SAME DAY COMPARISON:  Film from earlier in the same day. FINDINGS: Cardiac shadow is within normal limits. The left lung is now well aerated without definitive residual pneumothorax. Small amount of subcutaneous emphysema on the left is noted. The right lung remains clear. The degree of midline shift to the right has resolved as well. IMPRESSION: Resolution of left-sided pneumothorax following chest tube placement. Electronically Signed   By: Alcide Clever M.D.   On: 09/13/2018 07:39   Dg Chest Portable 1 View  Result Date: 09/13/2018 CLINICAL DATA:  Short of breath EXAM: PORTABLE CHEST 1 VIEW COMPARISON:  None. FINDINGS: Large left-sided pneumothorax. Near complete collapse of the left lung. Mild mediastinal shift to the right suggesting mild tension. No effusion Right lung is clear IMPRESSION: Near complete pneumothorax on the left with mild tension. These results were called by telephone at the time of interpretation on 09/13/2018 at 7:09 am to Dr. Glynn Octave , who verbally acknowledged these results.  Electronically Signed   By: Marlan Palau M.D.   On: 09/13/2018 07:09    EKG: Independently reviewed. Sinus tachycardia  Assessment/Plan Principal Problem:   Pneumothorax, left     1. Left pneumothorax.  Spontaneous pneumothorax.  Chest tube placed in the emergency room.  Etiology is unclear.  He does not have any medical history or significant family history.  Will check alpha-1 antitrypsin.  General surgery following to help assist with chest tube management.  We will repeat chest x-ray in the morning.  Continue pain management.  DVT prophylaxis: lovenox  Code Status: full code  Family Communication: discussed with mother and father at the bedside  Disposition Plan: discharge home once improved  Consults called: General surgery Admission status: Inpatient, MedSurg  Erick Blinks MD [facility] Pager [phone]  If 7PM-7AM, please contact night-coverage [email or web address] Password [email or web address]  09/13/2018, 8:56 AM   Addendum 18:00:  Family has requested that Dr. Juanetta Gosling consult on this patient. I have informed Dr. Juanetta Gosling of consult and he will see the patient in the morning.  [employer]

## 2018-09-14 DIAGNOSIS — J930 Spontaneous tension pneumothorax: Secondary | ICD-10-CM

## 2018-09-14 DIAGNOSIS — R0781 Pleurodynia: Secondary | ICD-10-CM

## 2018-09-14 DIAGNOSIS — J939 Pneumothorax, unspecified: Secondary | ICD-10-CM

## 2018-09-14 LAB — BASIC METABOLIC PANEL
ANION GAP: 4 — AB (ref 5–15)
BUN: 12 mg/dL (ref 6–20)
CO2: 29 mmol/L (ref 22–32)
Calcium: 9.4 mg/dL (ref 8.9–10.3)
Chloride: 105 mmol/L (ref 98–111)
Creatinine, Ser: 1.22 mg/dL (ref 0.61–1.24)
GFR calc Af Amer: >60 mL/min (ref >60)
GFR calc non Af Amer: >60 mL/min (ref >60)
GLUCOSE: 99 mg/dL (ref 70–99)
POTASSIUM: 4.6 mmol/L (ref 3.5–5.1)
Sodium: 138 mmol/L (ref 135–145)

## 2018-09-14 LAB — CBC
HEMATOCRIT: 42.3 % (ref 39.0–52.0)
HEMOGLOBIN: 13.3 g/dL (ref 13.0–17.0)
MCH: 27.3 pg (ref 26.0–34.0)
MCHC: 31.4 g/dL (ref 30.0–36.0)
MCV: 86.9 fL (ref 80.0–100.0)
PLATELETS: 180 10*3/uL (ref 150–400)
RBC: 4.87 MIL/uL (ref 4.22–5.81)
RDW: 12.5 % (ref 11.5–15.5)
WBC: 4.1 10*3/uL (ref 4.0–10.5)
nRBC: 0 % (ref 0.0–0.2)

## 2018-09-14 NOTE — Consult Note (Signed)
Consult requested by: [facility], Dr. Laural Benes Consult requested for: Spontaneous pneumothorax  HPI: This is a 22 year old with no medical problems who came to the emergency room because of chest discomfort.  He was asleep and when he woke from sleep he felt left-sided chest pain and extreme shortness of breath.  He called for help was brought to the emergency department and was found to have a large left pneumothorax.  No previous history of any sort of lung disease.  He is a lifelong non-smoker does not use vapes and does not have any other exposures.  He is on no medications.  There is no family history of any sort of lung trouble on either side.  He had chest tube placed and feels better  Past Medical History:  Diagnosis Date  . Migraines      Family History  Problem Relation Age of Onset  . Migraines Mother      Social History   Socioeconomic History  . Marital status: Single    Spouse name: Not on file  . Number of children: Not on file  . Years of education: Not on file  . Highest education level: Not on file  Occupational History  . Not on file  Social Needs  . Financial resource strain: Not on file  . Food insecurity:    Worry: Not on file    Inability: Not on file  . Transportation needs:    Medical: Not on file    Non-medical: Not on file  Tobacco Use  . Smoking status: Never Smoker  . Smokeless tobacco: Never Used  Substance and Sexual Activity  . Alcohol use: No    Frequency: Never  . Drug use: No  . Sexual activity: Not on file  Lifestyle  . Physical activity:    Days per week: Not on file    Minutes per session: Not on file  . Stress: Not on file  Relationships  . Social connections:    Talks on phone: Not on file    Gets together: Not on file    Attends religious service: Not on file    Active member of club or organization: Not on file    Attends meetings of clubs or organizations: Not on file    Relationship status: Not on file  Other  Topics Concern  . Not on file  Social History Narrative  . Not on file     ROS: Except as mentioned 10 point review of systems is negative    Objective: Vital signs in last 24 hours: Temp:  [97.7 F (36.5 C)-98.3 F (36.8 C)] 98.3 F (36.8 C) (10/13 0552) Pulse Rate:  [50-67] 51 (10/13 0552) Resp:  [13-20] 16 (10/13 0552) BP: (107-128)/(62-72) 107/64 (10/13 0552) SpO2:  [97 %-100 %] 97 % (10/13 0552) Weight change:     Intake/Output from previous day: 10/12 0701 - 10/13 0700 In: -  Out: 300 [Urine:300]  PHYSICAL EXAM Constitutional: He is awake and alert and in no acute distress eyes: Pupils react EOMI.  Ears nose mouth and throat: Mucous membranes are moist.  Hearing is grossly normal.  Cardiovascular: His heart is regular with normal heart sounds.  Respiratory: He has chest tube in place on the left breath sounds are equal bilaterally.  Gastrointestinal: Abdomen is soft with no masses.  Skin: Warm and dry.  Musculoskeletal: Normal strength bilaterally.  Neurological: No focal abnormalities.  Psychiatric: Normal mood and affect  Lab Results: Basic Metabolic Panel: Recent Labs  09/13/18 0600 09/14/18 0528  NA 144 138  K 3.8 4.6  CL 109 105  CO2 27 29  GLUCOSE 125* 99  BUN 14 12  CREATININE 1.13 1.22  CALCIUM 9.7 9.4   Liver Function Tests: No results for input(s): AST, ALT, ALKPHOS, BILITOT, PROT, ALBUMIN in the last 72 hours. No results for input(s): LIPASE, AMYLASE in the last 72 hours. No results for input(s): AMMONIA in the last 72 hours. CBC: Recent Labs    09/13/18 0600 09/14/18 0528  WBC 3.5* 4.1  NEUTROABS 1.5*  --   HGB 15.5 13.3  HCT 47.3 42.3  MCV 85.5 86.9  PLT 227 180   Cardiac Enzymes: Recent Labs    09/13/18 0600  TROPONINI <0.03   BNP: No results for input(s): PROBNP in the last 72 hours. D-Dimer: Recent Labs    09/13/18 0600  DDIMER <0.27   CBG: Recent Labs    09/13/18 1621  GLUCAP 94   Hemoglobin A1C: No results  for input(s): HGBA1C in the last 72 hours. Fasting Lipid Panel: No results for input(s): CHOL, HDL, LDLCALC, TRIG, CHOLHDL, LDLDIRECT in the last 72 hours. Thyroid Function Tests: No results for input(s): TSH, T4TOTAL, FREET4, T3FREE, THYROIDAB in the last 72 hours. Anemia Panel: No results for input(s): VITAMINB12, FOLATE, FERRITIN, TIBC, IRON, RETICCTPCT in the last 72 hours. Coagulation: No results for input(s): LABPROT, INR in the last 72 hours. Urine Drug Screen: Drugs of Abuse  No results found for: LABOPIA, COCAINSCRNUR, LABBENZ, AMPHETMU, THCU, LABBARB  Alcohol Level: No results for input(s): ETH in the last 72 hours. Urinalysis: No results for input(s): COLORURINE, LABSPEC, PHURINE, GLUCOSEU, HGBUR, BILIRUBINUR, KETONESUR, PROTEINUR, UROBILINOGEN, NITRITE, LEUKOCYTESUR in the last 72 hours.  Invalid input(s): APPERANCEUR Misc. Labs:   ABGS: No results for input(s): PHART, PO2ART, TCO2, HCO3 in the last 72 hours.  Invalid input(s): PCO2   MICROBIOLOGY: No results found for this or any previous visit (from the past 240 hour(s)).  Studies/Results: Dg Chest 1v Repeat Same Day  Result Date: 09/13/2018 CLINICAL DATA:  Status post left chest tube placement EXAM: CHEST - 1 VIEW SAME DAY COMPARISON:  Film from earlier in the same day. FINDINGS: Cardiac shadow is within normal limits. The left lung is now well aerated without definitive residual pneumothorax. Small amount of subcutaneous emphysema on the left is noted. The right lung remains clear. The degree of midline shift to the right has resolved as well. IMPRESSION: Resolution of left-sided pneumothorax following chest tube placement. Electronically Signed   By: Alcide Clever M.D.   On: 09/13/2018 07:39   Portable Chest 1 View  Result Date: 09/13/2018 CLINICAL DATA:  Follow-up pneumothorax EXAM: PORTABLE CHEST 1 VIEW COMPARISON:  Film from earlier in the same day. FINDINGS: Left-sided chest tube is again identified. No  recurrent pneumothorax is seen. Subcutaneous emphysema is again identified on the left. The right lung remains clear. Cardiac shadow is stable. IMPRESSION: No recurrent pneumothorax identified. Electronically Signed   By: Alcide Clever M.D.   On: 09/13/2018 13:57   Dg Chest Portable 1 View  Result Date: 09/13/2018 CLINICAL DATA:  Short of breath EXAM: PORTABLE CHEST 1 VIEW COMPARISON:  None. FINDINGS: Large left-sided pneumothorax. Near complete collapse of the left lung. Mild mediastinal shift to the right suggesting mild tension. No effusion Right lung is clear IMPRESSION: Near complete pneumothorax on the left with mild tension. These results were called by telephone at the time of interpretation on 09/13/2018 at 7:09 am to Dr. Jeannett Senior  RANCOUR , who verbally acknowledged these results. Electronically Signed   By: Marlan Palau M.D.   On: 09/13/2018 07:09    Medications:  Prior to Admission:  No medications prior to admission.   Scheduled: . heparin  5,000 Units Subcutaneous Q8H  . ketorolac  30 mg Intravenous Q6H  . sodium chloride flush  3 mL Intravenous Q12H  . traMADol  50 mg Oral Once   Continuous: . sodium chloride     GNF:AOZHYQ chloride, acetaminophen **OR** acetaminophen, HYDROmorphone (DILAUDID) injection, ondansetron **OR** ondansetron (ZOFRAN) IV, polyethylene glycol, sodium chloride flush  Assesment: He has spontaneous left pneumothorax.  He has reinflated with chest tube.  He has no medical problems known.  Alpha-1 antitrypsin level is pending but it is unlikely that he has that with his negative family history. Principal Problem:   Pneumothorax, left Active Problems:   Pneumothorax    Plan: Continue chest tube.  Discussed with Dr. Lovell Sheehan.  Potential removal of chest tube tomorrow.  He will need CT once he is better to make sure he does not have bullous disease.    LOS: 1 day   Williette Loewe L 09/14/2018, 10:39 AM

## 2018-09-14 NOTE — Progress Notes (Signed)
PROGRESS NOTE    Jaxxson Cavanah  ZOX:[id number]  DOB: [birth year]  DOA: 09/13/2018 PCP: Lindaann Pascal, PA-C   Brief Admission Hx: Yves Fodor is a 22 y.o. male with no significant past medical history who is not on any chronic medications.  Patient reported waking up at 5 AM today with worsening shortness of breath and chest pain.  Chest pain was described left chest and radiated to his left upper back.  Shortness of breath was worse when he was laying down.  He did not have any fever or cough.  Patient attempted to get up and move around to see if this would help his symptoms, but they continue to progress.  EMS was called and he was brought to the hospital for evaluation.  Patient denies any history of smoking or vaping.  He was admitted with left lung pneumothorax and had a chest tube placed.   MDM/Assessment & Plan:   1. Spontaneous pneumothorax of left lung - Pt has chest tube in place and tolerating it.  His alpha-1-antitrypsin testing is pending.  He is breathing better and his pain is well controlled. General surgery will see him. Family also requested pulmonary consult with Dr. Juanetta Gosling.    DVT prophylaxis: lovenox Code Status: Full  Family Communication: patient updated at bedside Disposition Plan: home when medically/surgically cleared   Consultants:  General surgery  pulmonology  Procedures:  Chest tube inserted 10/12   Subjective: Pt without complaints. Pain is well controlled. No SOB or chest pain.    Objective: Vitals:   09/13/18 1500 09/13/18 1517 09/13/18 2248 09/14/18 0552  BP: 112/67 128/72 110/66 107/64  Pulse: (!) 54 (!) 54 (!) 59 (!) 51  Resp: 16 20 16 16   Temp:  97.7 F (36.5 C) 97.7 F (36.5 C) 98.3 F (36.8 C)  TempSrc:  Oral Oral Oral  SpO2: 100% 99% 99% 97%  Weight:      Height:        Intake/Output Summary (Last 24 hours) at 09/14/2018 0931 Last data filed at 09/14/2018 8119 Gross per 24 hour  Intake 480 ml  Output 600  ml  Net -120 ml   Filed Weights   09/13/18 0627  Weight: 63.5 kg     REVIEW OF SYSTEMS  As per history otherwise all reviewed and reported negative  Exam:  General exam: awake, alert, NAD, cooperative.  Respiratory system: left side chest tube in place.  No increased work of breathing. Cardiovascular system: S1 & S2 heard, RRR. No JVD, murmurs, gallops, clicks or pedal edema. Gastrointestinal system: Abdomen is nondistended, soft and nontender. Normal bowel sounds heard. Central nervous system: Alert and oriented. No focal neurological deficits. Extremities: no CCE.  Data Reviewed: Basic Metabolic Panel: Recent Labs  Lab 09/13/18 0600 09/14/18 0528  NA 144 138  K 3.8 4.6  CL 109 105  CO2 27 29  GLUCOSE 125* 99  BUN 14 12  CREATININE 1.13 1.22  CALCIUM 9.7 9.4   Liver Function Tests: No results for input(s): AST, ALT, ALKPHOS, BILITOT, PROT, ALBUMIN in the last 168 hours. No results for input(s): LIPASE, AMYLASE in the last 168 hours. No results for input(s): AMMONIA in the last 168 hours. CBC: Recent Labs  Lab 09/13/18 0600 09/14/18 0528  WBC 3.5* 4.1  NEUTROABS 1.5*  --   HGB 15.5 13.3  HCT 47.3 42.3  MCV 85.5 86.9  PLT 227 180   Cardiac Enzymes: Recent Labs  Lab 09/13/18 0600  TROPONINI <0.03  CBG (last 3)  Recent Labs    09/13/18 1621  GLUCAP 94   No results found for this or any previous visit (from the past 240 hour(s)).   Studies: Dg Chest 1v Repeat Same Day  Result Date: 09/13/2018 CLINICAL DATA:  Status post left chest tube placement EXAM: CHEST - 1 VIEW SAME DAY COMPARISON:  Film from earlier in the same day. FINDINGS: Cardiac shadow is within normal limits. The left lung is now well aerated without definitive residual pneumothorax. Small amount of subcutaneous emphysema on the left is noted. The right lung remains clear. The degree of midline shift to the right has resolved as well. IMPRESSION: Resolution of left-sided pneumothorax  following chest tube placement. Electronically Signed   By: Alcide Clever M.D.   On: 09/13/2018 07:39   Portable Chest 1 View  Result Date: 09/13/2018 CLINICAL DATA:  Follow-up pneumothorax EXAM: PORTABLE CHEST 1 VIEW COMPARISON:  Film from earlier in the same day. FINDINGS: Left-sided chest tube is again identified. No recurrent pneumothorax is seen. Subcutaneous emphysema is again identified on the left. The right lung remains clear. Cardiac shadow is stable. IMPRESSION: No recurrent pneumothorax identified. Electronically Signed   By: Alcide Clever M.D.   On: 09/13/2018 13:57   Dg Chest Portable 1 View  Result Date: 09/13/2018 CLINICAL DATA:  Short of breath EXAM: PORTABLE CHEST 1 VIEW COMPARISON:  None. FINDINGS: Large left-sided pneumothorax. Near complete collapse of the left lung. Mild mediastinal shift to the right suggesting mild tension. No effusion Right lung is clear IMPRESSION: Near complete pneumothorax on the left with mild tension. These results were called by telephone at the time of interpretation on 09/13/2018 at 7:09 am to Dr. Glynn Octave , who verbally acknowledged these results. Electronically Signed   By: Marlan Palau M.D.   On: 09/13/2018 07:09   Scheduled Meds: . heparin  5,000 Units Subcutaneous Q8H  . ketorolac  30 mg Intravenous Q6H  . sodium chloride flush  3 mL Intravenous Q12H  . traMADol  50 mg Oral Once   Continuous Infusions: . sodium chloride      Principal Problem:   Pneumothorax, left Active Problems:   Pneumothorax   Time spent:   Standley Dakins, MD, FAAFP [facility] Pager [phone]  If 7PM-7AM, please contact night-coverage [email or web address] Password [email or web address] 09/14/2018, 9:31 AM    LOS: 1 day

## 2018-09-14 NOTE — Consult Note (Signed)
Reason for Consult: Spontaneous left pneumothorax Referring Physician: Dr. Hulen Skains is an 22 y.o. male.  HPI: Patient is a 22 year old white male who had a sudden onset of left-sided chest pain and shortness of breath.  In the emergency room, he was noted to have a tension left pneumothorax.  A chest tube was placed and the patient was admitted to the hospital for further evaluation and treatment.  Surgery has been consulted to manage the chest tube.  Patient does not smoke.  He was not doing any strenuous activity.  There is no family history of lung issues.  He currently has 0 out of 10 chest pain.  Past Medical History:  Diagnosis Date  . Migraines     Past Surgical History:  Procedure Laterality Date  . NO PAST SURGERIES      Family History  Problem Relation Age of Onset  . Migraines Mother     Social History:  reports that he has never smoked. He has never used smokeless tobacco. He reports that he does not drink alcohol or use drugs.  Allergies: No Known Allergies  Medications: I have reviewed the patient's current medications.  Results for orders placed or performed during the hospital encounter of 09/13/18 (from the past 48 hour(s))  CBC with Differential/Platelet     Status: Abnormal   Collection Time: 09/13/18  6:00 AM  Result Value Ref Range   WBC 3.5 (L) 4.0 - 10.5 K/uL   RBC 5.53 4.22 - 5.81 MIL/uL   Hemoglobin 15.5 13.0 - 17.0 g/dL   HCT 47.3 39.0 - 52.0 %   MCV 85.5 80.0 - 100.0 fL   MCH 28.0 26.0 - 34.0 pg   MCHC 32.8 30.0 - 36.0 g/dL   RDW 12.1 11.5 - 15.5 %   Platelets 227 150 - 400 K/uL   nRBC 0.0 0.0 - 0.2 %   Neutrophils Relative % 44 %   Neutro Abs 1.5 (L) 1.7 - 7.7 K/uL   Lymphocytes Relative 43 %   Lymphs Abs 1.5 0.7 - 4.0 K/uL   Monocytes Relative 10 %   Monocytes Absolute 0.4 0.1 - 1.0 K/uL   Eosinophils Relative 2 %   Eosinophils Absolute 0.1 0.0 - 0.5 K/uL   Basophils Relative 1 %   Basophils Absolute 0.0 0.0 - 0.1  K/uL   Immature Granulocytes 0 %   Abs Immature Granulocytes 0.01 0.00 - 0.07 K/uL    Comment: Performed at [facility], [address], [place], [place] [address]  Basic metabolic panel     Status: Abnormal   Collection Time: 09/13/18  6:00 AM  Result Value Ref Range   Sodium 144 135 - 145 mmol/L   Potassium 3.8 3.5 - 5.1 mmol/L   Chloride 109 98 - 111 mmol/L   CO2 27 22 - 32 mmol/L   Glucose, Bld 125 (H) 70 - 99 mg/dL   BUN 14 6 - 20 mg/dL   Creatinine, Ser 1.13 0.61 - 1.24 mg/dL   Calcium 9.7 8.9 - 10.3 mg/dL   GFR calc non Af Amer >60 >60 mL/min   GFR calc Af Amer >60 >60 mL/min    Comment: (NOTE) The eGFR has been calculated using the CKD EPI equation. This calculation has not been validated in all clinical situations. eGFR's persistently <60 mL/min signify possible Chronic Kidney Disease.    Anion gap 8 5 - 15    Comment: Performed at [facility], [address] [place],  [address]  Troponin I     Status: None   Collection Time: 09/13/18  6:00 AM  Result Value Ref Range   Troponin I <0.03 <0.03 ng/mL    Comment: Performed at [facility], [address] [place], [place] [address]  D-dimer, quantitative (not at [facility])     Status: None   Collection Time: 09/13/18  6:00 AM  Result Value Ref Range   D-Dimer, Quant <0.27 0.00 - 0.50 ug/mL-FEU    Comment: (NOTE) At the manufacturer cut-off of 0.50 ug/mL FEU, this assay has been documented to exclude PE with a sensitivity and negative predictive value of 97 to 99%.  At this time, this assay has not been approved by the FDA to exclude DVT/VTE. Results should be correlated with clinical presentation. Performed at [facility], [address], [place], [place] [address]   Rapid HIV screen (HIV 1/2 Ab+Ag)     Status: None   Collection Time: 09/13/18  6:00 AM  Result Value Ref Range   HIV-1 P24 Antigen - HIV24 NON REACTIVE NON REACTIVE   HIV 1/2 Antibodies NON REACTIVE NON REACTIVE   Interpretation (HIV Ag Ab)      A  non reactive test result means that HIV 1 or HIV 2 antibodies and HIV 1 p24 antigen were not detected in the specimen.    Comment: Performed at [facility], [address] [place], [place] [address]  Glucose, capillary     Status: None   Collection Time: 09/13/18  4:21 PM  Result Value Ref Range   Glucose-Capillary 94 70 - 99 mg/dL  Basic metabolic panel     Status: Abnormal   Collection Time: 09/14/18  5:28 AM  Result Value Ref Range   Sodium 138 135 - 145 mmol/L   Potassium 4.6 3.5 - 5.1 mmol/L    Comment: DELTA CHECK NOTED   Chloride 105 98 - 111 mmol/L   CO2 29 22 - 32 mmol/L   Glucose, Bld 99 70 - 99 mg/dL   BUN 12 6 - 20 mg/dL   Creatinine, Ser 1.22 0.61 - 1.24 mg/dL   Calcium 9.4 8.9 - 10.3 mg/dL   GFR calc non Af Amer >60 >60 mL/min   GFR calc Af Amer >60 >60 mL/min    Comment: (NOTE) The eGFR has been calculated using the CKD EPI equation. This calculation has not been validated in all clinical situations. eGFR's persistently <60 mL/min signify possible Chronic Kidney Disease.    Anion gap 4 (L) 5 - 15    Comment: Performed at [facility], [address] [place], [place] [address]  CBC     Status: None   Collection Time: 09/14/18  5:28 AM  Result Value Ref Range   WBC 4.1 4.0 - 10.5 K/uL   RBC 4.87 4.22 - 5.81 MIL/uL   Hemoglobin 13.3 13.0 - 17.0 g/dL   HCT 42.3 39.0 - 52.0 %   MCV 86.9 80.0 - 100.0 fL   MCH 27.3 26.0 - 34.0 pg   MCHC 31.4 30.0 - 36.0 g/dL   RDW 12.5 11.5 - 15.5 %   Platelets 180 150 - 400 K/uL   nRBC 0.0 0.0 - 0.2 %    Comment: Performed at [facility], [address], [place], [place] [address]    Dg Chest 1v Repeat Same Day  Result Date: 09/13/2018 CLINICAL DATA:  Status post left chest tube placement EXAM: CHEST - 1 VIEW SAME DAY COMPARISON:  Film from earlier in the same day. FINDINGS:  Cardiac shadow is within normal limits. The left lung is now well aerated without definitive residual pneumothorax. Small amount of subcutaneous emphysema  on the left is noted. The right lung remains clear. The degree of midline shift to the right has resolved as well. IMPRESSION: Resolution of left-sided pneumothorax following chest tube placement. Electronically Signed   By: Inez Catalina M.D.   On: 09/13/2018 07:39   Portable Chest 1 View  Result Date: 09/13/2018 CLINICAL DATA:  Follow-up pneumothorax EXAM: PORTABLE CHEST 1 VIEW COMPARISON:  Film from earlier in the same day. FINDINGS: Left-sided chest tube is again identified. No recurrent pneumothorax is seen. Subcutaneous emphysema is again identified on the left. The right lung remains clear. Cardiac shadow is stable. IMPRESSION: No recurrent pneumothorax identified. Electronically Signed   By: Inez Catalina M.D.   On: 09/13/2018 13:57   Dg Chest Portable 1 View  Result Date: 09/13/2018 CLINICAL DATA:  Short of breath EXAM: PORTABLE CHEST 1 VIEW COMPARISON:  None. FINDINGS: Large left-sided pneumothorax. Near complete collapse of the left lung. Mild mediastinal shift to the right suggesting mild tension. No effusion Right lung is clear IMPRESSION: Near complete pneumothorax on the left with mild tension. These results were called by telephone at the time of interpretation on 09/13/2018 at 7:09 am to Dr. Ezequiel Essex , who verbally acknowledged these results. Electronically Signed   By: Franchot Gallo M.D.   On: 09/13/2018 07:09    ROS:  Pertinent items are noted in HPI.  Blood pressure 107/64, pulse (!) 51, temperature 98.3 F (36.8 C), temperature source Oral, resp. rate 16, height _0  (1.753 m), weight 63.5 kg, SpO2 97 %. Physical Exam: Pleasant well-developed well-nourished white male in no acute distress Head is normocephalic, atraumatic Lungs are clear to auscultation.  Equal breath sounds bilaterally.  Left chest tube in place with intermittent air leak with coughing.  When I readjusted the suction to the Pleur-evac, a significant amount of air was noted to leak.  This subsequently  calm down.  Chest x-ray images personally reviewed  Assessment/Plan: Impression: Spontaneous left pneumothorax.  Still with air leak on hospital day 1. Plan: Continue current management at -20 cm suction.  Will reassess in a.m.  Should he continue to have an air leak, will recommend transfer to thoracic surgery at [facility].  Patient and father aware and agreed to treatment plan.  Aviva Signs 09/14/2018, 11:19 AM

## 2018-09-15 ENCOUNTER — Inpatient Hospital Stay (HOSPITAL_COMMUNITY): Payer: 59

## 2018-09-15 DIAGNOSIS — J9383 Other pneumothorax: Secondary | ICD-10-CM | POA: Diagnosis present

## 2018-09-15 DIAGNOSIS — Z9689 Presence of other specified functional implants: Secondary | ICD-10-CM

## 2018-09-15 DIAGNOSIS — J9311 Primary spontaneous pneumothorax: Secondary | ICD-10-CM

## 2018-09-15 DIAGNOSIS — J939 Pneumothorax, unspecified: Secondary | ICD-10-CM

## 2018-09-15 DIAGNOSIS — J930 Spontaneous tension pneumothorax: Principal | ICD-10-CM

## 2018-09-15 LAB — BLOOD GAS, ARTERIAL
Acid-Base Excess: 2.1 mmol/L — ABNORMAL HIGH (ref 0.0–2.0)
Bicarbonate: 25 mmol/L (ref 20.0–28.0)
Drawn by: 535471
O2 Saturation: 98.9 %
Patient temperature: 98.6
pCO2 arterial: 31.2 mmHg — ABNORMAL LOW (ref 32.0–48.0)
pH, Arterial: 7.514 — ABNORMAL HIGH (ref 7.350–7.450)
pO2, Arterial: 126 mmHg — ABNORMAL HIGH (ref 83.0–108.0)

## 2018-09-15 LAB — CBC
HCT: 41.8 % (ref 39.0–52.0)
Hemoglobin: 13.8 g/dL (ref 13.0–17.0)
MCH: 28.3 pg (ref 26.0–34.0)
MCHC: 33 g/dL (ref 30.0–36.0)
MCV: 85.7 fL (ref 80.0–100.0)
Platelets: 203 10*3/uL (ref 150–400)
RBC: 4.88 MIL/uL (ref 4.22–5.81)
RDW: 11.9 % (ref 11.5–15.5)
WBC: 6.7 10*3/uL (ref 4.0–10.5)
nRBC: 0 % (ref 0.0–0.2)

## 2018-09-15 LAB — TYPE AND SCREEN
ABO/RH(D): A POS
Antibody Screen: NEGATIVE

## 2018-09-15 LAB — PROTIME-INR
INR: 1.01
Prothrombin Time: 13.2 seconds (ref 11.4–15.2)

## 2018-09-15 LAB — ABO/RH: ABO/RH(D): A POS

## 2018-09-15 LAB — COMPREHENSIVE METABOLIC PANEL
ALT: 16 U/L (ref 0–44)
AST: 21 U/L (ref 15–41)
Albumin: 3.9 g/dL (ref 3.5–5.0)
Alkaline Phosphatase: 54 U/L (ref 38–126)
Anion gap: 5 (ref 5–15)
BUN: 13 mg/dL (ref 6–20)
CO2: 30 mmol/L (ref 22–32)
Calcium: 9.4 mg/dL (ref 8.9–10.3)
Chloride: 104 mmol/L (ref 98–111)
Creatinine, Ser: 1.27 mg/dL — ABNORMAL HIGH (ref 0.61–1.24)
GFR calc Af Amer: >60 mL/min (ref >60)
GFR calc non Af Amer: >60 mL/min (ref >60)
Glucose, Bld: 124 mg/dL — ABNORMAL HIGH (ref 70–99)
Potassium: 4.2 mmol/L (ref 3.5–5.1)
Sodium: 139 mmol/L (ref 135–145)
Total Bilirubin: 0.5 mg/dL (ref 0.3–1.2)
Total Protein: 6.4 g/dL — ABNORMAL LOW (ref 6.5–8.1)

## 2018-09-15 LAB — APTT: aPTT: 30 seconds (ref 24–36)

## 2018-09-15 MED ORDER — CEFAZOLIN SODIUM-DEXTROSE 2-4 GM/100ML-% IV SOLN
2.0000 g | INTRAVENOUS | Status: AC
  Administered 2018-09-16: 2 g via INTRAVENOUS
  Filled 2018-09-15: qty 100

## 2018-09-15 MED ORDER — KETOROLAC TROMETHAMINE 15 MG/ML IJ SOLN
15.0000 mg | Freq: Four times a day (QID) | INTRAMUSCULAR | Status: AC
  Administered 2018-09-15 – 2018-09-17 (×6): 15 mg via INTRAVENOUS
  Filled 2018-09-15 (×6): qty 1

## 2018-09-15 NOTE — Progress Notes (Signed)
Report called to [facility] on 6N at [facility].  Patient in NAD at this time, IV patent and WNL.  [facility] on the way

## 2018-09-15 NOTE — Progress Notes (Signed)
  Subjective: No shortness of breath.  Objective: Vital signs in last 24 hours: Temp:  [98.1 F (36.7 C)-98.4 F (36.9 C)] 98.1 F (36.7 C) (10/13 2115) Pulse Rate:  [53-81] 59 (10/14 0549) Resp:  [16-20] 20 (10/14 0549) BP: (105-125)/(64-77) 109/67 (10/14 0549) SpO2:  [98 %-99 %] 98 % (10/14 0549) Weight:  [55.6 kg] 55.6 kg (10/14 0549) Last BM Date: 09/12/18  Intake/Output from previous day: 10/13 0701 - 10/14 0700 In: 1200 [P.O.:1200] Out: 500 [Urine:500] Intake/Output this shift: No intake/output data recorded.  General appearance: alert, cooperative and no distress Resp: clear to auscultation bilaterally and Chest tube with intermittent air leak present.  Lab Results:  Recent Labs    09/13/18 0600 09/14/18 0528  WBC 3.5* 4.1  HGB 15.5 13.3  HCT 47.3 42.3  PLT 227 180   BMET Recent Labs    09/13/18 0600 09/14/18 0528  NA 144 138  K 3.8 4.6  CL 109 105  CO2 27 29  GLUCOSE 125* 99  BUN 14 12  CREATININE 1.13 1.22  CALCIUM 9.7 9.4   PT/INR No results for input(s): LABPROT, INR in the last 72 hours.  Studies/Results: Dg Chest Port 1 View  Result Date: 09/15/2018 CLINICAL DATA:  Follow-up left-sided pneumothorax with chest tube treatment. EXAM: PORTABLE CHEST 1 VIEW COMPARISON:  Portable chest x-ray of September 13, 2018 FINDINGS: There Is re- current lucency in the left pulmonary apex with a faint pleural line visible compatible with approximately 5-10% left apical pneumothorax. This is more conspicuous than on the previous study. A pigtail catheter projects over the medial aspect of the left eighth rib. There is a trace of pleural fluid on the left. There is no mediastinal shift. The right lung is well-expanded and clear. The heart and pulmonary vascularity are normal. There is decreased subcutaneous emphysema in the left axillary region. The bony structures are unremarkable. IMPRESSION: Interval increase in the size of the left pneumothorax such that it  measures between 5 and 10% of the pleural space volume. Trace left pleural effusion. Electronically Signed   By: David  [place]aziland M.D.   On: 09/15/2018 07:30   Portable Chest 1 View  Result Date: 09/13/2018 CLINICAL DATA:  Follow-up pneumothorax EXAM: PORTABLE CHEST 1 VIEW COMPARISON:  Film from earlier in the same day. FINDINGS: Left-sided chest tube is again identified. No recurrent pneumothorax is seen. Subcutaneous emphysema is again identified on the left. The right lung remains clear. Cardiac shadow is stable. IMPRESSION: No recurrent pneumothorax identified. Electronically Signed   By: Alcide Clever M.D.   On: 09/13/2018 13:57    Anti-infectives: Anti-infectives (From admission, onward)   None      Assessment/Plan: Impression: Spontaneous left pneumothorax.  Chest x-ray shows possible 5 to 10% apical pneumo.  Patient continues to have intermittent air leak.  Patient needs consultation with thoracic surgery for further management and treatment.  Discussed with Dr. Laural Benes.  LOS: 2 days    Franky Macho 09/15/2018

## 2018-09-15 NOTE — Progress Notes (Signed)
Patient has room available at [facility].  Patient and family made aware.  Awaiting carelink for transport

## 2018-09-15 NOTE — Progress Notes (Signed)
He still has a pneumothorax.  He is being transferred to [facility] for T surgery evaluation and potential surgery.  I will plan to sign off.  Thanks for allowing me to see him with you

## 2018-09-15 NOTE — H&P (Addendum)
[address]       [place] [address]             [phone]        Donne Hazel [place] Medical Record #[id number] Date of Birth: [birth year]  Referring: [facility] Primary Care: Lindaann Pascal, PA-C Primary Cardiologist:No primary care provider on file.  Chief Complaint:    Chief Complaint  Patient presents with  . Shortness of Breath    History of Present Illness:       Mr. Froman is a 22 yo white male who is a non-smoker.  He presented to the ED on 10/12 with complaints of chest discomfort and worsening shortness of breath.  The patient states that he woke up around 5 AM with pain across his left chest from his hip to his shoulder.  He then developed worsening shortness of breath and associated cough.  He called EMS and was transported to the ED for evaluation.  CXR was obtained and showed a large left side pneumothorax.  He underwent chest tube placement with near complete resolution of his pneumothorax.  However, after transition to water seal he re-developed a small left apical pneumothorax with persistent air leak.  It was felt VATS procedure would be indicated and transfer was requested to [facility] for further care.  Currently the patient is stable.  He continues to have some discomfort at his chest tube site, but he denies chest pain and shortness of breath.  He denies smoking and marijuana use.  He works for [employer] on [employer].  He also lifts weights, but he did not left weights on Friday.  He denies previous history of pneumothorax.  Current Activity/ Functional Status: Patient is independent with mobility/ambulation, transfers, ADL's, IADL's.   Past Medical History:  Diagnosis Date  . Migraines     Past Surgical History:  Procedure Laterality Date  . NO PAST SURGERIES      Social History   Tobacco Use  Smoking Status Never Smoker  Smokeless Tobacco Never Used    Social History   Substance and Sexual Activity    Alcohol Use No  . Frequency: Never     No Known Allergies  Current Facility-Administered Medications  Medication Dose Route Frequency Provider Last Rate Last Dose  . 0.9 %  sodium chloride infusion  250 mL Intravenous PRN Erick Blinks, MD      . acetaminophen (TYLENOL) tablet 650 mg  650 mg Oral Q6H PRN Erick Blinks, MD   650 mg at 09/14/18 1623   Or  . acetaminophen (TYLENOL) suppository 650 mg  650 mg Rectal Q6H PRN Erick Blinks, MD      . ceFAZolin (ANCEF) IVPB 2g/100 mL premix  2 g Intravenous 30 min Pre-Op Donata Clay, Theron Arista, MD      . heparin injection 5,000 Units  5,000 Units Subcutaneous Q8H Erick Blinks, MD   5,000 Units at 09/15/18 4696  . HYDROmorphone (DILAUDID) injection 1 mg  1 mg Intravenous Q4H PRN Erick Blinks, MD   1 mg at 09/15/18 1226  . ketorolac (TORADOL) 15 MG/ML injection 15 mg  15 mg Intravenous Q6H Donata Clay, Theron Arista, MD      . ondansetron [facility]) tablet 4 mg  4 mg Oral Q6H PRN Erick Blinks, MD       Or  . ondansetron (ZOFRAN) injection 4 mg  4 mg Intravenous Q6H PRN Erick Blinks, MD   4 mg at 09/13/18 1707  .  polyethylene glycol (MIRALAX / GLYCOLAX) packet 17 g  17 g Oral Daily PRN Erick Blinks, MD      . sodium chloride flush (NS) 0.9 % injection 3 mL  3 mL Intravenous Q12H Erick Blinks, MD   3 mL at 09/15/18 0938  . sodium chloride flush (NS) 0.9 % injection 3 mL  3 mL Intravenous PRN Erick Blinks, MD      . traMADol (ULTRAM) tablet 50 mg  50 mg Oral Once Audrea Muscat T, NP        No medications prior to admission.    Family History  Problem Relation Age of Onset  . Migraines Mother    Review of Systems:   Review of Systems  Cardiovascular: Orthopnea:     Constitutional: negative for chills, fevers and sweats Respiratory: positive for shortness of breath, cough on presentation, has since resolved Cardiovascular: positive for chest pain and resolved since admission, does have pain at chest tube site Gastrointestinal:  negative Integument/breast: negative Neurological: negative for coordination problems and dizziness     Cardiac Review of Systems: Y or  [    ]= no  Chest Pain [  Y, resolved]  Resting SOB [   ] Exertional SOB  [  ]  Orthopnea [  ]   Pedal Edema [   ]    Palpitations [  ] Syncope  [  ]   Presyncope [   ]  General Review of Systems: [Y] = yes [  ]=no Constitional: recent weight change [  ]; anorexia [  ]; fatigue Klaus.Mock  ]; nausea [ N ]; night sweats Klaus.Mock  ]; fever Klaus.Mock  ]; or chills [  ]                                                               Dental: Last Dentist visit:   Eye : blurred vision [  ]; diplopia [   ]; vision changes Klaus.Mock  ];  Amaurosis fugax[  ]; Resp: cough [Y, resolved  ];  wheezing[ N ];  hemoptysis[N  ]; shortness of breath[Y, resolved  ]; paroxysmal nocturnal dyspnea[  ]; dyspnea on exertion[  ]; or orthopnea[  ];  GI:  gallstones[  ], vomiting[  ];  dysphagia[  ]; melena[  ];  hematochezia [  ]; heartburn[  ];   Hx of  Colonoscopy[  ]; GU: kidney stones [  ]; hematuria[  ];   dysuria [  ];  nocturia[  ];  history of     obstruction [  ]; urinary frequency [  ]             Skin: rash, swelling[ N ];, hair loss[  ];  peripheral edema[  ];  or itching[  ]; Musculosketetal: myalgias[  ];  joint swelling[  ];  joint erythema[  ];  joint pain[  ];  back pain[  ];  Heme/Lymph: bruising[  ];  bleeding[  ];  anemia[  ];  Neuro: TIA[  ];  headaches[  ];  stroke[  ];  vertigo[  ];  seizures[  ];   paresthesias[  ];  difficulty walking[  ];  Psych:depression[  ]; anxiety[  ];  Endocrine: diabetes[  ];  thyroid dysfunction[  ];  Physical  Exam: BP 119/62 (BP Location: Right Arm)   Pulse 67   Temp 98.3 F (36.8 C) (Oral)   Resp 18   Ht 5\' 5"  (1.651 m)   Wt 55.6 kg   SpO2 98%   BMI 20.40 kg/m   BP 119/62 (BP Location: Right Arm)   Pulse 67   Temp 98.3 F (36.8 C) (Oral)   Resp 18   Ht 5\' 5"  (1.651 m)   Wt 55.6 kg   SpO2 98%   BMI 20.40 kg/m   Gen: no apparent  distress Heart: RRR Lungs: CTA bilaterally Abd: soft non-tender, non-distended Ext: no edema present Neuro: grossly intact  Diagnostic Studies & Laboratory data:     Recent Radiology Findings:   Dg Chest Port 1 View  Result Date: 09/15/2018 CLINICAL DATA:  Follow-up left-sided pneumothorax with chest tube treatment. EXAM: PORTABLE CHEST 1 VIEW COMPARISON:  Portable chest x-ray of September 13, 2018 FINDINGS: There Is re- current lucency in the left pulmonary apex with a faint pleural line visible compatible with approximately 5-10% left apical pneumothorax. This is more conspicuous than on the previous study. A pigtail catheter projects over the medial aspect of the left eighth rib. There is a trace of pleural fluid on the left. There is no mediastinal shift. The right lung is well-expanded and clear. The heart and pulmonary vascularity are normal. There is decreased subcutaneous emphysema in the left axillary region. The bony structures are unremarkable. IMPRESSION: Interval increase in the size of the left pneumothorax such that it measures between 5 and 10% of the pleural space volume. Trace left pleural effusion. Electronically Signed   By: David  [place]aziland M.D.   On: 09/15/2018 07:30     I have independently reviewed the above radiologic studies and discussed with the patient   Recent Lab Findings: Lab Results  Component Value Date   WBC 6.7 09/15/2018   HGB 13.8 09/15/2018   HCT 41.8 09/15/2018   PLT 203 09/15/2018   GLUCOSE 124 (H) 09/15/2018   ALT 16 09/15/2018   AST 21 09/15/2018   NA 139 09/15/2018   K 4.2 09/15/2018   CL 104 09/15/2018   CREATININE 1.27 (H) 09/15/2018   BUN 13 09/15/2018   CO2 30 09/15/2018   TSH 2.270 01/24/2018   INR 1.01 09/15/2018    Assessment / Plan:       1. Spontaneous Pneumothorax- admitted to [facility] on 10/12, S/P Chest tube placement with persistent air leak on water seal and apical pneumothorax 2. Admit to inpatient at [facility] 3. Will obtain  CT scan of the chest to check for blebs 4. Pain control- oxycodone, tramadol 5. NPO at midnight, plan for VATs with pleurodesis and resection of blebs (if present) tomorrow afternoon   I  spent 60 minutes counseling the patient face to face.   Denny Peon Barrett PA-C 09/15/2018 4:36 PM Patient examined and chest x-ray images performed on admission and following left chest tube placement at [facility] personally reviewed.  Agree with above findings as reported above by Lowella Dandy, PA-C  22 year old tall thin male with first-time left spontaneous pneumothorax-he was very symptomatic with respiratory difficulty and a 90% pneumothorax on his presentation chest x-ray. Pigtail catheter placed by ED physician improved pneumothorax to 10% but he had persistent air leak on waterseal and was transferred to this hospital.  Currently after being on suction for couple hours his air leak seems to be significantly improved if not resolved.  We will leave him on  10 cm suction overnight, check chest x-ray in a.m. and if air leak is persistent proceed with left VATS and stapling of blebs.  Patient will obtain a CT scan of chest to further delineate his pulmonary anatomy.  If air leak resolves overnight and chest x-ray is no worse we will continue chest to manage of his spontaneous pneumothorax and hold on VATS surgery.  Lovett Sox MD

## 2018-09-15 NOTE — Progress Notes (Signed)
Patient arrived to unit. Patient is alert and oriented . Pain not noted on transfer. Will continue to monitor.

## 2018-09-15 NOTE — Progress Notes (Signed)
PROGRESS NOTE    Travis Morales  ZOX:[id number]  DOB: 09-11-1996  DOA: 09/13/2018 PCP: Lindaann Pascal, PA-C   Brief Admission Hx: Travis Morales is a 22 y.o. male with no significant past medical history who is not on any chronic medications.  Patient reported waking up at 5 AM today with worsening shortness of breath and chest pain.  Chest pain was described left chest and radiated to his left upper back.  Shortness of breath was worse when he was laying down.  He did not have any fever or cough.  Patient attempted to get up and move around to see if this would help his symptoms, but they continue to progress.  EMS was called and he was brought to the hospital for evaluation.  Patient denies any history of smoking or vaping.  He was admitted with left lung pneumothorax and had a chest tube placed.   MDM/Assessment & Plan:   1. Spontaneous pneumothorax of left lung - Pt has chest tube in place and tolerating it but still having intermittent air leaking.  His alpha-1-antitrypsin testing is pending.  He is breathing better and his pain is well controlled. General surgery recommended consulting CTS.  I spoke with Dr. Donata Clay with CTS who accepted patient and will make arrangements for patient to transfer to [facility].   DVT prophylaxis: lovenox Code Status: Full  Family Communication: patient updated at bedside Disposition Plan: home when surgically cleared   Consultants:  General surgery  pulmonology  Procedures:  Chest tube inserted 10/12   Subjective: Pt without complaints. Pain is well controlled. No SOB or chest pain.    Objective: Vitals:   09/14/18 1314 09/14/18 1429 09/14/18 2115 09/15/18 0549  BP: 125/75 105/77 111/64 109/67  Pulse: 81 71 (!) 53 (!) 59  Resp: 16 20 20 20   Temp: 98.4 F (36.9 C) 98.2 F (36.8 C) 98.1 F (36.7 C)   TempSrc: Oral Oral Oral   SpO2: 99% 98% 98% 98%  Weight:    55.6 kg  Height:    5\' 5"  (1.651 m)    Intake/Output  Summary (Last 24 hours) at 09/15/2018 8119 Last data filed at 09/14/2018 2335 Gross per 24 hour  Intake 1200 ml  Output 500 ml  Net 700 ml   Filed Weights   09/13/18 0627 09/15/18 0549  Weight: 63.5 kg 55.6 kg     REVIEW OF SYSTEMS  As per history otherwise all reviewed and reported negative  Exam:  General exam: awake, alert, NAD, cooperative.  Respiratory system: left side chest tube in place with intermittent air leaking seen.  No increased work of breathing. Cardiovascular system: S1 & S2 heard, RRR. No JVD, murmurs, gallops, clicks or pedal edema. Gastrointestinal system: Abdomen is nondistended, soft and nontender. Normal bowel sounds heard. Central nervous system: Alert and oriented. No focal neurological deficits. Extremities: no CCE.  Data Reviewed: Basic Metabolic Panel: Recent Labs  Lab 09/13/18 0600 09/14/18 0528  NA 144 138  K 3.8 4.6  CL 109 105  CO2 27 29  GLUCOSE 125* 99  BUN 14 12  CREATININE 1.13 1.22  CALCIUM 9.7 9.4   Liver Function Tests: No results for input(s): AST, ALT, ALKPHOS, BILITOT, PROT, ALBUMIN in the last 168 hours. No results for input(s): LIPASE, AMYLASE in the last 168 hours. No results for input(s): AMMONIA in the last 168 hours. CBC: Recent Labs  Lab 09/13/18 0600 09/14/18 0528  WBC 3.5* 4.1  NEUTROABS 1.5*  --  HGB 15.5 13.3  HCT 47.3 42.3  MCV 85.5 86.9  PLT 227 180   Cardiac Enzymes: Recent Labs  Lab 09/13/18 0600  TROPONINI <0.03   CBG (last 3)  Recent Labs    09/13/18 1621  GLUCAP 94   No results found for this or any previous visit (from the past 240 hour(s)).   Studies: Dg Chest Port 1 View  Result Date: 09/15/2018 CLINICAL DATA:  Follow-up left-sided pneumothorax with chest tube treatment. EXAM: PORTABLE CHEST 1 VIEW COMPARISON:  Portable chest x-ray of September 13, 2018 FINDINGS: There Is re- current lucency in the left pulmonary apex with a faint pleural line visible compatible with approximately  5-10% left apical pneumothorax. This is more conspicuous than on the previous study. A pigtail catheter projects over the medial aspect of the left eighth rib. There is a trace of pleural fluid on the left. There is no mediastinal shift. The right lung is well-expanded and clear. The heart and pulmonary vascularity are normal. There is decreased subcutaneous emphysema in the left axillary region. The bony structures are unremarkable. IMPRESSION: Interval increase in the size of the left pneumothorax such that it measures between 5 and 10% of the pleural space volume. Trace left pleural effusion. Electronically Signed   By: David  [place]iland M.D.   On: 09/15/2018 07:30   Portable Chest 1 View  Result Date: 09/13/2018 CLINICAL DATA:  Follow-up pneumothorax EXAM: PORTABLE CHEST 1 VIEW COMPARISON:  Film from earlier in the same day. FINDINGS: Left-sided chest tube is again identified. No recurrent pneumothorax is seen. Subcutaneous emphysema is again identified on the left. The right lung remains clear. Cardiac shadow is stable. IMPRESSION: No recurrent pneumothorax identified. Electronically Signed   By: Alcide Clever M.D.   On: 09/13/2018 13:57   Scheduled Meds: . heparin  5,000 Units Subcutaneous Q8H  . sodium chloride flush  3 mL Intravenous Q12H  . traMADol  50 mg Oral Once   Continuous Infusions: . sodium chloride      Principal Problem:   Pneumothorax, left Active Problems:   Pneumothorax   Pneumothorax, spontaneous, tension   Chest tube in place   Time spent:   Standley Dakins, MD, FAAFP [facility] Pager [phone]  If 7PM-7AM, please contact night-coverage [email or web address] Password [email or web address] 09/15/2018, 8:38 AM    LOS: 2 days

## 2018-09-16 ENCOUNTER — Inpatient Hospital Stay (HOSPITAL_COMMUNITY): Payer: 59

## 2018-09-16 ENCOUNTER — Inpatient Hospital Stay (HOSPITAL_COMMUNITY): Payer: 59 | Admitting: Certified Registered Nurse Anesthetist

## 2018-09-16 ENCOUNTER — Encounter (HOSPITAL_COMMUNITY)
Admission: EM | Disposition: A | Discharge status: Home / Self Care | Payer: Self-pay | Source: Home / Self Care | Attending: Cardiothoracic Surgery

## 2018-09-16 ENCOUNTER — Encounter (HOSPITAL_COMMUNITY): Payer: Self-pay

## 2018-09-16 DIAGNOSIS — Z9889 Other specified postprocedural states: Secondary | ICD-10-CM

## 2018-09-16 DIAGNOSIS — J439 Emphysema, unspecified: Secondary | ICD-10-CM | POA: Diagnosis present

## 2018-09-16 HISTORY — PX: VIDEO ASSISTED THORACOSCOPY: SHX5073

## 2018-09-16 HISTORY — PX: STAPLING OF BLEBS: SHX6429

## 2018-09-16 LAB — URINALYSIS, ROUTINE W REFLEX MICROSCOPIC
Bilirubin Urine: NEGATIVE
Glucose, UA: NEGATIVE mg/dL
Hgb urine dipstick: NEGATIVE
Ketones, ur: NEGATIVE mg/dL
Leukocytes, UA: NEGATIVE
Nitrite: NEGATIVE
Protein, ur: NEGATIVE mg/dL
Specific Gravity, Urine: 1.017 (ref 1.005–1.030)
pH: 6 (ref 5.0–8.0)

## 2018-09-16 LAB — SURGICAL PCR SCREEN
MRSA, PCR: NEGATIVE
Staphylococcus aureus: POSITIVE — AB

## 2018-09-16 LAB — COMPREHENSIVE METABOLIC PANEL
ALT: 18 U/L (ref 0–44)
AST: 17 U/L (ref 15–41)
Albumin: 3.6 g/dL (ref 3.5–5.0)
Alkaline Phosphatase: 46 U/L (ref 38–126)
Anion gap: 7 (ref 5–15)
BUN: 16 mg/dL (ref 6–20)
CO2: 28 mmol/L (ref 22–32)
Calcium: 9.5 mg/dL (ref 8.9–10.3)
Chloride: 106 mmol/L (ref 98–111)
Creatinine, Ser: 1.23 mg/dL (ref 0.61–1.24)
GFR calc Af Amer: >60 mL/min (ref >60)
GFR calc non Af Amer: >60 mL/min (ref >60)
Glucose, Bld: 103 mg/dL — ABNORMAL HIGH (ref 70–99)
Potassium: 4.3 mmol/L (ref 3.5–5.1)
Sodium: 141 mmol/L (ref 135–145)
Total Bilirubin: 0.4 mg/dL (ref 0.3–1.2)
Total Protein: 5.8 g/dL — ABNORMAL LOW (ref 6.5–8.1)

## 2018-09-16 LAB — PROTIME-INR
INR: 1.04
Prothrombin Time: 13.5 seconds (ref 11.4–15.2)

## 2018-09-16 LAB — ALPHA-1 ANTITRYPSIN PHENOTYPE: A1 ANTITRYPSIN SER: 110 mg/dL (ref 90–200)

## 2018-09-16 LAB — GLUCOSE, CAPILLARY
Glucose-Capillary: 137 mg/dL — ABNORMAL HIGH (ref 70–99)
Glucose-Capillary: 151 mg/dL — ABNORMAL HIGH (ref 70–99)

## 2018-09-16 SURGERY — VIDEO ASSISTED THORACOSCOPY
Anesthesia: General | Site: Chest | Laterality: Left

## 2018-09-16 MED ORDER — DEXAMETHASONE SODIUM PHOSPHATE 10 MG/ML IJ SOLN
INTRAMUSCULAR | Status: DC | PRN
  Administered 2018-09-16: 10 mg via INTRAVENOUS

## 2018-09-16 MED ORDER — 0.9 % SODIUM CHLORIDE (POUR BTL) OPTIME
TOPICAL | Status: DC | PRN
  Administered 2018-09-16: 1000 mL

## 2018-09-16 MED ORDER — ACETAMINOPHEN 160 MG/5ML PO SOLN: 1000.0000 mg | Freq: Four times a day (QID) | ORAL | Status: DC

## 2018-09-16 MED ORDER — SUGAMMADEX SODIUM 200 MG/2ML IV SOLN
INTRAVENOUS | Status: DC | PRN
  Administered 2018-09-16: 120 mg via INTRAVENOUS

## 2018-09-16 MED ORDER — DIPHENHYDRAMINE HCL 50 MG/ML IJ SOLN: 12.5000 mg | Freq: Four times a day (QID) | INTRAMUSCULAR | Status: DC | PRN

## 2018-09-16 MED ORDER — MIDAZOLAM HCL 5 MG/5ML IJ SOLN
INTRAMUSCULAR | Status: DC | PRN
  Administered 2018-09-16: 2 mg via INTRAVENOUS

## 2018-09-16 MED ORDER — MIDAZOLAM HCL 2 MG/2ML IJ SOLN
INTRAMUSCULAR | Status: AC
  Filled 2018-09-16: qty 2

## 2018-09-16 MED ORDER — LACTATED RINGERS IV SOLN: INTRAVENOUS | Status: DC

## 2018-09-16 MED ORDER — FENTANYL CITRATE (PF) 100 MCG/2ML IJ SOLN
INTRAMUSCULAR | Status: DC | PRN
  Administered 2018-09-16 (×2): 50 ug via INTRAVENOUS
  Administered 2018-09-16: 100 ug via INTRAVENOUS
  Administered 2018-09-16: 50 ug via INTRAVENOUS
  Administered 2018-09-16: 100 ug via INTRAVENOUS

## 2018-09-16 MED ORDER — INSULIN ASPART 100 UNIT/ML ~~LOC~~ SOLN
0.0000 [IU] | SUBCUTANEOUS | Status: DC
  Administered 2018-09-16 – 2018-09-17 (×3): 2 [IU] via SUBCUTANEOUS

## 2018-09-16 MED ORDER — SODIUM CHLORIDE 0.9% FLUSH: 9.0000 mL | INTRAVENOUS | Status: DC | PRN

## 2018-09-16 MED ORDER — FENTANYL CITRATE (PF) 100 MCG/2ML IJ SOLN: 25.0000 ug | INTRAMUSCULAR | Status: DC | PRN

## 2018-09-16 MED ORDER — POTASSIUM CHLORIDE 10 MEQ/50ML IV SOLN: 10.0000 meq | Freq: Every day | INTRAVENOUS | Status: DC | PRN

## 2018-09-16 MED ORDER — ACETAMINOPHEN 500 MG PO TABS
1000.0000 mg | ORAL_TABLET | Freq: Four times a day (QID) | ORAL | Status: DC
  Administered 2018-09-17 – 2018-09-19 (×8): 1000 mg via ORAL
  Filled 2018-09-16 (×8): qty 2

## 2018-09-16 MED ORDER — LIDOCAINE 2% (20 MG/ML) 5 ML SYRINGE
INTRAMUSCULAR | Status: DC | PRN
  Administered 2018-09-16: 60 mg via INTRAVENOUS

## 2018-09-16 MED ORDER — CEFAZOLIN SODIUM-DEXTROSE 2-4 GM/100ML-% IV SOLN
2.0000 g | Freq: Three times a day (TID) | INTRAVENOUS | Status: AC
  Administered 2018-09-16 – 2018-09-17 (×2): 2 g via INTRAVENOUS
  Filled 2018-09-16 (×2): qty 100

## 2018-09-16 MED ORDER — TRAMADOL HCL 50 MG PO TABS
50.0000 mg | ORAL_TABLET | Freq: Four times a day (QID) | ORAL | Status: DC | PRN
  Administered 2018-09-17 – 2018-09-18 (×3): 100 mg via ORAL
  Filled 2018-09-16 (×3): qty 2

## 2018-09-16 MED ORDER — ONDANSETRON HCL 4 MG/2ML IJ SOLN: 4.0000 mg | Freq: Four times a day (QID) | INTRAMUSCULAR | Status: DC | PRN

## 2018-09-16 MED ORDER — ENOXAPARIN SODIUM 40 MG/0.4ML ~~LOC~~ SOLN
40.0000 mg | Freq: Every day | SUBCUTANEOUS | Status: DC
  Administered 2018-09-17 – 2018-09-18 (×2): 40 mg via SUBCUTANEOUS
  Filled 2018-09-16 (×2): qty 0.4

## 2018-09-16 MED ORDER — HYDROMORPHONE HCL 1 MG/ML IJ SOLN: 0.2500 mg | INTRAMUSCULAR | Status: DC | PRN

## 2018-09-16 MED ORDER — FENTANYL 40 MCG/ML IV SOLN
INTRAVENOUS | Status: DC
  Administered 2018-09-16: 195 ug via INTRAVENOUS
  Administered 2018-09-16: 40 ug via INTRAVENOUS
  Administered 2018-09-17: 195 ug via INTRAVENOUS
  Administered 2018-09-17: 210 ug via INTRAVENOUS
  Administered 2018-09-17: 120 ug via INTRAVENOUS
  Administered 2018-09-17: 15 ug via INTRAVENOUS
  Administered 2018-09-17: 1000 ug via INTRAVENOUS
  Administered 2018-09-17: 140 ug via INTRAVENOUS
  Administered 2018-09-17 – 2018-09-18 (×2): 45 ug via INTRAVENOUS
  Administered 2018-09-18: 105 ug via INTRAVENOUS
  Filled 2018-09-16: qty 1000
  Filled 2018-09-16: qty 25

## 2018-09-16 MED ORDER — LACTATED RINGERS IV SOLN
INTRAVENOUS | Status: DC | PRN
  Administered 2018-09-16 (×2): via INTRAVENOUS

## 2018-09-16 MED ORDER — CHLORHEXIDINE GLUCONATE CLOTH 2 % EX PADS
6.0000 | MEDICATED_PAD | Freq: Every day | CUTANEOUS | Status: DC
  Administered 2018-09-16: 6 via TOPICAL

## 2018-09-16 MED ORDER — SENNOSIDES-DOCUSATE SODIUM 8.6-50 MG PO TABS
1.0000 | ORAL_TABLET | Freq: Every day | ORAL | Status: DC
  Administered 2018-09-17: 1 via ORAL
  Filled 2018-09-16 (×2): qty 1

## 2018-09-16 MED ORDER — KETOROLAC TROMETHAMINE 30 MG/ML IJ SOLN
INTRAMUSCULAR | Status: DC | PRN
  Administered 2018-09-16: 30 mg via INTRAVENOUS

## 2018-09-16 MED ORDER — TALC (STERITALC) POWDER FOR INTRAPLEURAL USE
4.0000 g | INTRAPLEURAL | Status: DC
  Filled 2018-09-16: qty 4

## 2018-09-16 MED ORDER — OXYCODONE HCL 5 MG PO TABS
5.0000 mg | ORAL_TABLET | ORAL | Status: DC | PRN
  Filled 2018-09-16: qty 2

## 2018-09-16 MED ORDER — NALOXONE HCL 0.4 MG/ML IJ SOLN: 0.4000 mg | INTRAMUSCULAR | Status: DC | PRN

## 2018-09-16 MED ORDER — DEXTROSE-NACL 5-0.45 % IV SOLN
INTRAVENOUS | Status: DC
  Administered 2018-09-16 – 2018-09-17 (×3): via INTRAVENOUS

## 2018-09-16 MED ORDER — ONDANSETRON HCL 4 MG/2ML IJ SOLN
INTRAMUSCULAR | Status: DC | PRN
  Administered 2018-09-16: 4 mg via INTRAVENOUS

## 2018-09-16 MED ORDER — FENTANYL CITRATE (PF) 250 MCG/5ML IJ SOLN
INTRAMUSCULAR | Status: AC
  Filled 2018-09-16: qty 5

## 2018-09-16 MED ORDER — MUPIROCIN 2 % EX OINT: 1.0000 "application " | TOPICAL_OINTMENT | Freq: Two times a day (BID) | CUTANEOUS | Status: DC

## 2018-09-16 MED ORDER — VANCOMYCIN HCL IN DEXTROSE 1-5 GM/200ML-% IV SOLN
1000.0000 mg | Freq: Two times a day (BID) | INTRAVENOUS | Status: AC
  Administered 2018-09-16: 1000 mg via INTRAVENOUS
  Filled 2018-09-16: qty 200

## 2018-09-16 MED ORDER — SUGAMMADEX SODIUM 500 MG/5ML IV SOLN
INTRAVENOUS | Status: AC
  Filled 2018-09-16: qty 5

## 2018-09-16 MED ORDER — PROPOFOL 10 MG/ML IV BOLUS
INTRAVENOUS | Status: DC | PRN
  Administered 2018-09-16: 150 mg via INTRAVENOUS
  Administered 2018-09-16: 50 mg via INTRAVENOUS

## 2018-09-16 MED ORDER — FENTANYL CITRATE (PF) 100 MCG/2ML IJ SOLN
INTRAMUSCULAR | Status: AC
  Filled 2018-09-16: qty 2

## 2018-09-16 MED ORDER — PHENYLEPHRINE 40 MCG/ML (10ML) SYRINGE FOR IV PUSH (FOR BLOOD PRESSURE SUPPORT)
PREFILLED_SYRINGE | INTRAVENOUS | Status: DC | PRN
  Administered 2018-09-16: 80 ug via INTRAVENOUS

## 2018-09-16 MED ORDER — BISACODYL 5 MG PO TBEC
10.0000 mg | DELAYED_RELEASE_TABLET | Freq: Every day | ORAL | Status: DC
  Administered 2018-09-17 – 2018-09-18 (×2): 10 mg via ORAL
  Filled 2018-09-16 (×3): qty 2

## 2018-09-16 MED ORDER — DIPHENHYDRAMINE HCL 12.5 MG/5ML PO ELIX
12.5000 mg | ORAL_SOLUTION | Freq: Four times a day (QID) | ORAL | Status: DC | PRN
  Filled 2018-09-16: qty 5

## 2018-09-16 MED ORDER — DEXMEDETOMIDINE HCL IN NACL 200 MCG/50ML IV SOLN
INTRAVENOUS | Status: DC | PRN
  Administered 2018-09-16: 12 ug via INTRAVENOUS
  Administered 2018-09-16: 8 ug via INTRAVENOUS
  Administered 2018-09-16: 12 ug via INTRAVENOUS
  Administered 2018-09-16: 8 ug via INTRAVENOUS

## 2018-09-16 MED ORDER — ROCURONIUM BROMIDE 50 MG/5ML IV SOSY
PREFILLED_SYRINGE | INTRAVENOUS | Status: DC | PRN
  Administered 2018-09-16 (×2): 50 mg via INTRAVENOUS

## 2018-09-16 SURGICAL SUPPLY — 71 items
ADH SKN CLS APL DERMABOND .7 (GAUZE/BANDAGES/DRESSINGS) ×2
BAG DECANTER FOR FLEXI CONT (MISCELLANEOUS) IMPLANT
BLADE SURG 11 STRL SS (BLADE) ×4 IMPLANT
CANISTER SUCT 3000ML PPV (MISCELLANEOUS) ×4 IMPLANT
CATH KIT ON Q 5IN SLV (PAIN MANAGEMENT) IMPLANT
CATH ROBINSON RED A/P 22FR (CATHETERS) IMPLANT
CATH THORACIC 28FR (CATHETERS) ×2 IMPLANT
CATH THORACIC 36FR (CATHETERS) IMPLANT
CATH THORACIC 36FR RT ANG (CATHETERS) IMPLANT
CLEANER TIP ELECTROSURG 2X2 (MISCELLANEOUS) ×2 IMPLANT
CLIP VESOCCLUDE MED 24/CT (CLIP) ×2 IMPLANT
CONT SPEC 4OZ CLIKSEAL STRL BL (MISCELLANEOUS) ×8 IMPLANT
COVER SURGICAL LIGHT HANDLE (MISCELLANEOUS) ×6 IMPLANT
COVER WAND RF STERILE (DRAPES) ×4 IMPLANT
CUTTER ECHEON FLEX ENDO 45 340 (ENDOMECHANICALS) ×2 IMPLANT
DERMABOND ADVANCED (GAUZE/BANDAGES/DRESSINGS) ×2
DERMABOND ADVANCED .7 DNX12 (GAUZE/BANDAGES/DRESSINGS) IMPLANT
DRAPE LAPAROSCOPIC ABDOMINAL (DRAPES) ×4 IMPLANT
DRAPE SLUSH/WARMER DISC (DRAPES) ×4 IMPLANT
ELECT BLADE 4.0 EZ CLEAN MEGAD (MISCELLANEOUS) ×4
ELECT BLADE 6.5 EXT (BLADE) ×2 IMPLANT
ELECT REM PT RETURN 9FT ADLT (ELECTROSURGICAL) ×4
ELECTRODE BLDE 4.0 EZ CLN MEGD (MISCELLANEOUS) IMPLANT
ELECTRODE REM PT RTRN 9FT ADLT (ELECTROSURGICAL) ×2 IMPLANT
GAUZE SPONGE 4X4 12PLY STRL (GAUZE/BANDAGES/DRESSINGS) ×4 IMPLANT
GLOVE BIO SURGEON STRL SZ7.5 (GLOVE) ×8 IMPLANT
GLOVE SURG SS PI 6.0 STRL IVOR (GLOVE) ×2 IMPLANT
GOWN STRL REUS W/ TWL LRG LVL3 (GOWN DISPOSABLE) ×6 IMPLANT
GOWN STRL REUS W/TWL LRG LVL3 (GOWN DISPOSABLE) ×16
KIT BASIN OR (CUSTOM PROCEDURE TRAY) ×4 IMPLANT
KIT SUCTION CATH 14FR (SUCTIONS) ×4 IMPLANT
KIT TURNOVER KIT B (KITS) ×4 IMPLANT
NS IRRIG 1000ML POUR BTL (IV SOLUTION) ×8 IMPLANT
PACK CHEST (CUSTOM PROCEDURE TRAY) ×4 IMPLANT
PAD ARMBOARD 7.5X6 YLW CONV (MISCELLANEOUS) ×8 IMPLANT
RELOAD STAPLE 45 GOLD REG/THCK (STAPLE) IMPLANT
SEALANT SURG COSEAL 4ML (VASCULAR PRODUCTS) IMPLANT
SOLUTION ANTI FOG 6CC (MISCELLANEOUS) ×4 IMPLANT
SPONGE LAP 18X18 X RAY DECT (DISPOSABLE) ×4 IMPLANT
SPONGE TONSIL TAPE 1 RFD (DISPOSABLE) ×4 IMPLANT
STAPLE RELOAD 45MM GOLD (STAPLE) ×16 IMPLANT
SUT CHROMIC 3 0 SH 27 (SUTURE) IMPLANT
SUT ETHILON 3 0 PS 1 (SUTURE) IMPLANT
SUT PROLENE 3 0 SH DA (SUTURE) IMPLANT
SUT PROLENE 4 0 RB 1 (SUTURE)
SUT PROLENE 4-0 RB1 .5 CRCL 36 (SUTURE) IMPLANT
SUT SILK  1 MH (SUTURE) ×4
SUT SILK 1 MH (SUTURE) ×4 IMPLANT
SUT SILK 2 0SH CR/8 30 (SUTURE) IMPLANT
SUT SILK 3 0SH CR/8 30 (SUTURE) IMPLANT
SUT VIC AB 1 CTX 18 (SUTURE) ×2 IMPLANT
SUT VIC AB 2 TP1 27 (SUTURE) IMPLANT
SUT VIC AB 2-0 CT2 18 VCP726D (SUTURE) IMPLANT
SUT VIC AB 2-0 CTX 36 (SUTURE) ×2 IMPLANT
SUT VIC AB 3-0 SH 18 (SUTURE) IMPLANT
SUT VIC AB 3-0 X1 27 (SUTURE) ×4 IMPLANT
SUT VICRYL 0 UR6 27IN ABS (SUTURE) ×4 IMPLANT
SUT VICRYL 2 TP 1 (SUTURE) IMPLANT
SWAB COLLECTION DEVICE MRSA (MISCELLANEOUS) IMPLANT
SWAB CULTURE ESWAB REG 1ML (MISCELLANEOUS) IMPLANT
SYSTEM SAHARA CHEST DRAIN ATS (WOUND CARE) ×4 IMPLANT
TAPE CLOTH SURG 4X10 WHT LF (GAUZE/BANDAGES/DRESSINGS) ×2 IMPLANT
TIP APPLICATOR SPRAY EXTEND 16 (VASCULAR PRODUCTS) IMPLANT
TOWEL GREEN STERILE (TOWEL DISPOSABLE) ×4 IMPLANT
TOWEL GREEN STERILE FF (TOWEL DISPOSABLE) ×4 IMPLANT
TRAP SPECIMEN MUCOUS 40CC (MISCELLANEOUS) IMPLANT
TRAY FOLEY MTR SLVR 16FR STAT (SET/KITS/TRAYS/PACK) ×4 IMPLANT
TROCAR BLADELESS 5MM (ENDOMECHANICALS) IMPLANT
TROCAR XCEL NON-BLD 5MMX100MML (ENDOMECHANICALS) ×4 IMPLANT
TUNNELER SHEATH ON-Q 11GX8 DSP (PAIN MANAGEMENT) IMPLANT
WATER STERILE IRR 1000ML POUR (IV SOLUTION) ×8 IMPLANT

## 2018-09-16 NOTE — Anesthesia Preprocedure Evaluation (Addendum)
Anesthesia Evaluation  Patient identified by MRN, date of birth, ID band Patient awake    Reviewed: Allergy & Precautions, H&P , NPO status , Patient's Chart, lab work & pertinent test results  Airway Mallampati: I  TM Distance: >3 FB Neck ROM: Full    Dental no notable dental hx. (+) Teeth Intact, Dental Advisory Given   Pulmonary neg pulmonary ROS,    Pulmonary exam normal breath sounds clear to auscultation       Cardiovascular negative cardio ROS   Rhythm:Regular Rate:Normal     Neuro/Psych  Headaches, negative psych ROS   GI/Hepatic negative GI ROS, Neg liver ROS,   Endo/Other  negative endocrine ROS  Renal/GU negative Renal ROS  negative genitourinary   Musculoskeletal   Abdominal   Peds  Hematology negative hematology ROS (+)   Anesthesia Other Findings   Reproductive/Obstetrics negative OB ROS                            Anesthesia Physical Anesthesia Plan  ASA: II  Anesthesia Plan: General   Post-op Pain Management:    Induction: Intravenous  PONV Risk Score and Plan: 3 and Ondansetron and Dexamethasone  Airway Management Planned: Double Lumen EBT  Additional Equipment: Arterial line  Intra-op Plan:   Post-operative Plan: Extubation in OR  Informed Consent: I have reviewed the patients History and Physical, chart, labs and discussed the procedure including the risks, benefits and alternatives for the proposed anesthesia with the patient or authorized representative who has indicated his/her understanding and acceptance.   Dental advisory given  Plan Discussed with: CRNA  Anesthesia Plan Comments:         Anesthesia Quick Evaluation

## 2018-09-16 NOTE — Anesthesia Procedure Notes (Signed)
Arterial Line Insertion Start/End10/15/2019 12:45 PM Performed by: Jed Limerick, CRNA, CRNA  Patient location: Pre-op. Preanesthetic checklist: patient identified, IV checked, site marked, risks and benefits discussed, surgical consent, monitors and equipment checked, pre-op evaluation, timeout performed and anesthesia consent Lidocaine 1% used for infiltration Right, radial was placed Catheter size: 20 G Hand hygiene performed  and maximum sterile barriers used   Attempts: 1 Procedure performed without using ultrasound guided technique. Following insertion, dressing applied and Biopatch. Post procedure assessment: normal and unchanged  Patient tolerated the procedure well with no immediate complications.

## 2018-09-16 NOTE — Plan of Care (Signed)
  Problem: Clinical Measurements: Goal: Respiratory complications will improve Outcome: Progressing   Problem: Coping: Goal: Level of anxiety will decrease Outcome: Progressing   

## 2018-09-16 NOTE — Progress Notes (Signed)
Procedure(s) (LRB): VIDEO ASSISTED THORACOSCOPY (Left) STAPLING OF BLEBS (Left) Subjective: + air leak thru tube, apical pntx slightly larger CT shows apical bleb disease Will proceed with L VATS today  Objective: Vital signs in last 24 hours: Temp:  [98.2 F (36.8 C)-98.3 F (36.8 C)] 98.2 F (36.8 C) (10/15 0534) Pulse Rate:  [57-67] 65 (10/15 0534) Resp:  [16-18] 17 (10/15 0534) BP: (119-121)/(62-83) 121/63 (10/15 0534) SpO2:  [95 %-99 %] 95 % (10/15 0534)  Hemodynamic parameters for last 24 hours:    Intake/Output from previous day: 10/14 0701 - 10/15 0700 In: 840 [P.O.:840] Out: 970 [Urine:950; Chest Tube:20] Intake/Output this shift: No intake/output data recorded.  Alert and comfortable  Lab Results: Recent Labs    09/14/18 0528 09/15/18 1421  WBC 4.1 6.7  HGB 13.3 13.8  HCT 42.3 41.8  PLT 180 203   BMET:  Recent Labs    09/15/18 1421 09/16/18 0155  NA 139 141  K 4.2 4.3  CL 104 106  CO2 30 28  GLUCOSE 124* 103*  BUN 13 16  CREATININE 1.27* 1.23  CALCIUM 9.4 9.5    PT/INR:  Recent Labs    09/16/18 0155  LABPROT 13.5  INR 1.04   ABG    Component Value Date/Time   PHART 7.514 (H) 09/15/2018 1430   HCO3 25.0 09/15/2018 1430   O2SAT 98.9 09/15/2018 1430   CBG (last 3)  Recent Labs    09/13/18 1621  GLUCAP 94    Assessment/Plan: S/P Procedure(s) (LRB): VIDEO ASSISTED THORACOSCOPY (Left) STAPLING OF BLEBS (Left) VATS today Orders in place   LOS: 3 days    Kathlee Nations Trigt III 09/16/2018

## 2018-09-16 NOTE — Transfer of Care (Signed)
Immediate Anesthesia Transfer of Care Note  Patient: Travis Morales  Procedure(s) Performed: VIDEO ASSISTED THORACOSCOPY (Left Chest) STAPLING OF BLEBS (Left )  Patient Location: PACU  Anesthesia Type:General  Level of Consciousness: awake and patient cooperative  Airway & Oxygen Therapy: Patient Spontanous Breathing  Post-op Assessment: Report given to RN and Post -op Vital signs reviewed and stable  Post vital signs: Reviewed and stable  Last Vitals:  Vitals Value Taken Time  BP 125/85 09/16/2018  4:26 PM  Temp    Pulse 68 09/16/2018  4:30 PM  Resp 17 09/16/2018  4:30 PM  SpO2 100 % 09/16/2018  4:30 PM  Vitals shown include unvalidated device data.  Last Pain:  Vitals:   09/16/18 0534  TempSrc: Oral  PainSc:       Patients Stated Pain Goal: 3 (09/15/18 1231)  Complications: No apparent anesthesia complications

## 2018-09-16 NOTE — Progress Notes (Signed)
Transferred in from the PACU   By bed , lethargic but arousable.

## 2018-09-16 NOTE — Anesthesia Procedure Notes (Signed)
Procedure Name: Intubation Date/Time: 09/16/2018 2:47 PM Performed by: Lance Coon, CRNA Pre-anesthesia Checklist: Emergency Drugs available, Patient being monitored, Patient identified, Suction available and Timeout performed Patient Re-evaluated:Patient Re-evaluated prior to induction Oxygen Delivery Method: Circle system utilized Preoxygenation: Pre-oxygenation with 100% oxygen Induction Type: IV induction Ventilation: Mask ventilation without difficulty Laryngoscope Size: Mac and 4 Grade View: Grade II Endobronchial tube: Left, EBT position confirmed by auscultation, EBT position confirmed by fiberoptic bronchoscope and Double lumen EBT and 39 Fr Number of attempts: 2 Airway Equipment and Method: Stylet Placement Confirmation: ETT inserted through vocal cords under direct vision Tube secured with: Tape Dental Injury: Teeth and Oropharynx as per pre-operative assessment  Comments: DLx1 miller 3 - esophabation, DLx1 successful mac4

## 2018-09-16 NOTE — Anesthesia Postprocedure Evaluation (Signed)
Anesthesia Post Note  Patient: Rudene Anda Gammage  Procedure(s) Performed: VIDEO ASSISTED THORACOSCOPY (Left Chest) STAPLING OF BLEBS (Left )     Patient location during evaluation: PACU Anesthesia Type: General Level of consciousness: awake and alert Pain management: pain level controlled Vital Signs Assessment: post-procedure vital signs reviewed and stable Respiratory status: spontaneous breathing, nonlabored ventilation, respiratory function stable and patient connected to nasal cannula oxygen Cardiovascular status: blood pressure returned to baseline and stable Postop Assessment: no apparent nausea or vomiting Anesthetic complications: no    Last Vitals:  Vitals:   09/16/18 1712 09/16/18 1727  BP: 122/85 126/83  Pulse: (!) 52 63  Resp: 19 (!) 28  Temp:  37 C  SpO2: 100% 100%    Last Pain:  Vitals:   09/16/18 1727  TempSrc:   PainSc: 5                  Marasia Newhall,W. EDMOND

## 2018-09-16 NOTE — Discharge Summary (Signed)
Physician Discharge Summary  Patient ID: Travis Morales MRN: [id number] DOB/AGE: May 07, 1996 22 y.o.  Admit date: 09/13/2018 Discharge date: 09/19/2018  Admission Diagnoses:  Patient Active Problem List   Diagnosis Date Noted  . Spontaneous pneumothorax, principal problem 09/15/2018  . Chest tube in place   . Pneumothorax on left   . Pneumothorax, spontaneous, tension   . Pneumothorax, left 09/13/2018  . Pneumothorax 09/13/2018  . Chronic migraine without aura without status migrainosus, not intractable 01/27/2018   Discharge Diagnoses:   Patient Active Problem List   Diagnosis Date Noted  . Pneumothorax, left 09/13/2018    Priority: Low  . S/P thoracotomy 09/16/2018  . Bleb, lung (HCC) 09/16/2018  . Spontaneous pneumothorax 09/15/2018  . Chest tube in place   . Pneumothorax on left   . Pneumothorax, spontaneous, tension   . Pneumothorax 09/13/2018  . Chronic migraine without aura without status migrainosus, not intractable 01/27/2018    Discharged Condition: good  History of Present Illness:  Travis Morales is a 22 yo white male who is a non-smoker.  He presented to the ED on 10/12 with complaints of chest discomfort and worsening shortness of breath.  The patient states that he woke up around 5 AM with pain across his left chest from his hip to his shoulder.  He then developed worsening shortness of breath and associated cough.  He called EMS and was transported to the ED for evaluation.  CXR was obtained and showed a large left side pneumothorax.  He underwent chest tube placement with near complete resolution of his pneumothorax.  However, after transition to water seal he re-developed a small left apical pneumothorax with persistent air leak. It was felt VATS procedure would be indicated and transfer was requested to [facility] for further care.     Hospital Course:   Patient was stable on arrival to [facility].  He was admitted via the [facility].  The patient denied chest pain and shortness of breath.  He did admit to the some discomfort at chest tube site.  His chest tube remained in place and did have an air leak on water seal.  Therefore, his chest tube was placed back on suction and CT scan was obtained.  This showed a persistent 10 % apical pneumothorax.  He had evidence of apical blebs on the right and left side.  There was also evidence of left chest wall emphysema.  It was felt the patient would require a Left VATS procedure with resection of apical blebs and mechanical pleurodesis. The risks and benefits of the procedure were explained to the patient and he was agreeable to proceed.  He was taken to the operating room on 09/16/2018.  He underwent Left Video Assisted Thoracoscopy with stapling of left apical blebs and mechanical pleurodesis.  He tolerated the procedure without difficulty and he was taken to the stepdown unit in stable condition.  He did well post operatively.  His arterial line was removed without difficulty.  His chest tube did not show evidence of an air leak.  CXR was free from pneumothorax, but did show an apical space where his bleb was removed.  His chest tube was transitioned to water seal and later removed on 09/18/2018.  Follow up CXR remained stable without significant pneumothorax.  He is ambulating without difficulty.  His incisions are healing without evidence of infection.  He is not using pain medication and he is tolerating a regular diet.  He is  medically stable for discharge home today.  Significant Diagnostic Studies: radiology:   CT scan:   1. Left lateral chest tube with pigtail positioned medially in the pleural space. There is a small left apical and anterior pneumothorax estimated at less than 10%. 2. There are biapical blebs 3. There is partial atelectasis in the left lower lobe. 4. Moderate left chest wall emphysema  Treatments: surgery:   Left VATS (video-assisted  thoracoscopic surgery) with stapling of apical blebs and mechanical pleurodesis.   Discharge Exam: Blood pressure 118/74, pulse 61, temperature 98.2 F (36.8 C), temperature source Oral, resp. rate 19, height 5\' 5"  (1.651 m), weight 55.6 kg, SpO2 99 %.  General appearance: alert, cooperative and no distress Heart: regular rate and rhythm Lungs: clear to auscultation bilaterally Abdomen: soft, non-tender; bowel sounds normal; no masses,  no organomegaly Extremities: extremities normal, atraumatic, no cyanosis or edema Wound: clean and dry  Disposition: Home  Discharge Medications:   Allergies as of 09/19/2018   No Known Allergies     Medication List    TAKE these medications   acetaminophen 500 MG tablet Commonly known as:  TYLENOL Take 2 tablets (1,000 mg total) by mouth every 6 (six) hours as needed.   ibuprofen 800 MG tablet Commonly known as:  ADVIL,MOTRIN Take 1 tablet (800 mg total) by mouth every 8 (eight) hours as needed for moderate pain.   traMADol 50 MG tablet Commonly known as:  ULTRAM Take 1 tablet (50 mg total) by mouth every 6 (six) hours as needed (mild pain).      Follow-up Information    Kerin Perna, MD Follow up on 10/01/2018.   Specialty:  Cardiothoracic Surgery Why:  Appointment is at 2:30, please get CXR at 2:00 at [facility] located on first floor of our office building Contact information: [address] [place] [address] [phone]        [facility] Follow up on 09/26/2018.   Specialty:  Cardiothoracic Surgery Contact information: [address] [place], [address] [place] [address] [phone]          Signed: Lowella Dandy 09/19/2018, 8:14 AM

## 2018-09-16 NOTE — Brief Op Note (Signed)
09/16/2018  4:05 PM  PATIENT:  Travis Morales  22 y.o. male  PRE-OPERATIVE DIAGNOSIS:  BLEBS   POST-OPERATIVE DIAGNOSIS:  BLEBS  PROCEDURE:  Procedure(s): VIDEO ASSISTED THORACOSCOPY (Left) STAPLING OF BLEBS (Left)  SURGEON:  Surgeon(s) and Role:    Kerin Perna, MD - Primary  PHYSICIAN ASSISTANT:  Jari Favre, PA-C   ANESTHESIA:   general  EBL:  100 mL   BLOOD ADMINISTERED:none  DRAINS: ONE STRAIGHT CHEST TUBE   LOCAL MEDICATIONS USED:  NONE  SPECIMEN:  Source of Specimen:  LEFT APICAL BLEBS  DISPOSITION OF SPECIMEN:  PATHOLOGY  COUNTS:  YES  DICTATION: .Dragon Dictation  PLAN OF CARE: Admit to inpatient   PATIENT DISPOSITION:  PACU - hemodynamically stable.   Delay start of Pharmacological VTE agent (>24hrs) due to surgical blood loss or risk of bleeding: no

## 2018-09-17 ENCOUNTER — Encounter (HOSPITAL_COMMUNITY): Payer: Self-pay | Admitting: Cardiothoracic Surgery

## 2018-09-17 ENCOUNTER — Inpatient Hospital Stay (HOSPITAL_COMMUNITY): Payer: 59

## 2018-09-17 LAB — CBC
HEMATOCRIT: 42.8 % (ref 39.0–52.0)
Hemoglobin: 13.9 g/dL (ref 13.0–17.0)
MCH: 27.4 pg (ref 26.0–34.0)
MCHC: 32.5 g/dL (ref 30.0–36.0)
MCV: 84.3 fL (ref 80.0–100.0)
PLATELETS: 208 10*3/uL (ref 150–400)
RBC: 5.08 MIL/uL (ref 4.22–5.81)
RDW: 11.6 % (ref 11.5–15.5)
WBC: 8.6 10*3/uL (ref 4.0–10.5)
nRBC: 0 % (ref 0.0–0.2)

## 2018-09-17 LAB — BLOOD GAS, ARTERIAL
ACID-BASE EXCESS: 2.2 mmol/L — AB (ref 0.0–2.0)
BICARBONATE: 26.4 mmol/L (ref 20.0–28.0)
O2 Content: 2 L/min
O2 SAT: 99.2 %
PCO2 ART: 42.7 mmHg (ref 32.0–48.0)
PH ART: 7.408 (ref 7.350–7.450)
Patient temperature: 98.6
pO2, Arterial: 165 mmHg — ABNORMAL HIGH (ref 83.0–108.0)

## 2018-09-17 LAB — BASIC METABOLIC PANEL
Anion gap: 6 (ref 5–15)
BUN: 10 mg/dL (ref 6–20)
CHLORIDE: 103 mmol/L (ref 98–111)
CO2: 26 mmol/L (ref 22–32)
CREATININE: 0.92 mg/dL (ref 0.61–1.24)
Calcium: 9.4 mg/dL (ref 8.9–10.3)
GFR calc Af Amer: >60 mL/min (ref >60)
GFR calc non Af Amer: >60 mL/min (ref >60)
GLUCOSE: 143 mg/dL — AB (ref 70–99)
Potassium: 4.5 mmol/L (ref 3.5–5.1)
Sodium: 135 mmol/L (ref 135–145)

## 2018-09-17 LAB — GLUCOSE, CAPILLARY
Glucose-Capillary: 127 mg/dL — ABNORMAL HIGH (ref 70–99)
Glucose-Capillary: 96 mg/dL (ref 70–99)

## 2018-09-17 NOTE — Progress Notes (Signed)
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

## 2018-09-17 NOTE — Progress Notes (Addendum)
      [address]       [place] [address]             [phone]      1 Day Post-Op Procedure(s) (LRB): VIDEO ASSISTED THORACOSCOPY (Left) STAPLING OF BLEBS (Left)   Subjective:  Patient doing okay. He does have some pain, which is relieved with pain medication.  He is hungry as well.  Denies N/V  Objective: Vital signs in last 24 hours: Temp:  [97.5 F (36.4 C)-98.8 F (37.1 C)] 98.2 F (36.8 C) (10/16 0354) Pulse Rate:  [49-91] 54 (10/16 0354) Cardiac Rhythm: Normal sinus rhythm;Sinus bradycardia (10/16 0410) Resp:  [14-28] 14 (10/16 0413) BP: (103-127)/(66-88) 103/66 (10/16 0354) SpO2:  [98 %-100 %] 100 % (10/16 0413) Arterial Line BP: (145-153)/(67-72) 153/72 (10/15 1727)  Intake/Output from previous day: 10/15 0701 - 10/16 0700 In: 2409 [I.V.:2309; IV Piggyback:100] Out: 1524 [Urine:1300; Blood:100; Chest Tube:124]  General appearance: alert, cooperative and no distress Heart: regular rate and rhythm Lungs: clear to auscultation bilaterally Abdomen: soft, non-tender; bowel sounds normal; no masses,  no organomegaly Extremities: extremities normal, atraumatic, no cyanosis or edema Wound: clean and dry  Lab Results: Recent Labs    09/15/18 1421 09/17/18 0441  WBC 6.7 8.6  HGB 13.8 13.9  HCT 41.8 42.8  PLT 203 208   BMET:  Recent Labs    09/16/18 0155 09/17/18 0441  NA 141 135  K 4.3 4.5  CL 106 103  CO2 28 26  GLUCOSE 103* 143*  BUN 16 10  CREATININE 1.23 0.92  CALCIUM 9.5 9.4    PT/INR:  Recent Labs    09/16/18 0155  LABPROT 13.5  INR 1.04   ABG    Component Value Date/Time   PHART 7.408 09/17/2018 0410   HCO3 26.4 09/17/2018 0410   O2SAT 99.2 09/17/2018 0410   CBG (last 3)  Recent Labs    09/16/18 2356 09/17/18 0352 09/17/18 0759  GLUCAP 151* 127* 96    Assessment/Plan: S/P Procedure(s) (LRB): VIDEO ASSISTED THORACOSCOPY (Left) STAPLING OF BLEBS (Left)  1. Chest tube- no air leak present, minimal  output since surgery- will leave chest tube on suction today, transition to water seal at midnight, repeat CXR in AM 2. Pulm- no acute issues, CXR without pneumothorax, there is a space from resection, mild sub q air on left 3. CV- hemodynamically stable 4. Place IV Fluid to KVO 5. D/C Arterial line  6. Lovenox for DVT prophylaxis 7. Dispo- patient stable, chest tube to water seal at midnight, start liquid diet, d/c arterial line, dvt prophylaxis   LOS: 4 days    Lowella Dandy 09/17/2018  doing well after VATS bleb resection Leave chest tube today- CXR in am patient examined and medical record reviewed,agree with above note. Kathlee Nations Trigt III 09/17/2018

## 2018-09-17 NOTE — Op Note (Signed)
NAME: Travis Morales, Travis Morales MEDICAL RECORD ZO:[id number] ACCOUNT [id number] DATE OF BIRTH:11-21-1996 FACILITY: [facility] LOCATION: [facility] PHYSICIAN: VAN TRIGT III, MD  OPERATIVE REPORT  DATE OF PROCEDURE:  09/16/2018  OPERATION:  Left VATS (video-assisted thoracoscopic surgery) with stapling of apical blebs and mechanical pleurodesis.  PREOPERATIVE DIAGNOSIS:  Spontaneous left pneumothorax with persistent air leak.  POSTOPERATIVE DIAGNOSIS:  Spontaneous left pneumothorax with persistent air leak.  SURGEON:  Mikey Bussing, MD  ASSISTANT:  Jari Favre, PA-C   ANESTHESIA:  General.  DESCRIPTION OF PROCEDURE:  The patient was brought from preop holding where informed consent was documented and the proper site marked and final issues were addressed with the patient and the family.  The patient was placed supine on the operating table  and general anesthesia was induced.  A double-lumen endotracheal tube was placed.  The patient was then turned left side up.  The previously placed pigtail catheter was then removed.  The left chest was prepped and draped as a sterile field.  A proper time-out was performed.  Three small incisions were made around the circumference of the left chest for the camera and thorascopic instruments.  The lung was fairly well collapsed when  the camera was placed.  Apical foam from a slow air leak was noted.  Manipulation of the lung demonstrated a ruptured bleb and some small blebs associated, and these were all removed using the Endo GIA stapling device in 2 different wedge resections.   The specimens were submitted for pathology.  There appeared to be no significant bleeding or air leak.  A mechanical pleurodesis was then performed on the upper half of the left hemithorax using the Bovie scratch pad.  A 28-French chest tube was then  placed and directed to the apex, and the lung was reexpanded under direct vision.  The VATS incisions were closed in layers  using Vicryl, and the chest tube was connected to a Pleur-evac drainage system.  The patient was turned supine, extubated, and then returned to recovery room.  He tolerated the procedure well.  LN/NUANCE  D:09/16/2018 T:09/17/2018 JOB:[id number]

## 2018-09-18 ENCOUNTER — Inpatient Hospital Stay (HOSPITAL_COMMUNITY): Payer: 59

## 2018-09-18 LAB — CBC
HCT: 42.3 % (ref 39.0–52.0)
HEMOGLOBIN: 13.4 g/dL (ref 13.0–17.0)
MCH: 27.1 pg (ref 26.0–34.0)
MCHC: 31.7 g/dL (ref 30.0–36.0)
MCV: 85.5 fL (ref 80.0–100.0)
Platelets: 186 10*3/uL (ref 150–400)
RBC: 4.95 MIL/uL (ref 4.22–5.81)
RDW: 12.1 % (ref 11.5–15.5)
WBC: 5.6 10*3/uL (ref 4.0–10.5)
nRBC: 0 % (ref 0.0–0.2)

## 2018-09-18 LAB — COMPREHENSIVE METABOLIC PANEL
ALK PHOS: 47 U/L (ref 38–126)
ALT: 51 U/L — AB (ref 0–44)
AST: 50 U/L — ABNORMAL HIGH (ref 15–41)
Albumin: 3.4 g/dL — ABNORMAL LOW (ref 3.5–5.0)
Anion gap: 7 (ref 5–15)
BUN: 13 mg/dL (ref 6–20)
CALCIUM: 9.4 mg/dL (ref 8.9–10.3)
CO2: 28 mmol/L (ref 22–32)
CREATININE: 1.08 mg/dL (ref 0.61–1.24)
Chloride: 102 mmol/L (ref 98–111)
Glucose, Bld: 99 mg/dL (ref 70–99)
Potassium: 4.3 mmol/L (ref 3.5–5.1)
Sodium: 137 mmol/L (ref 135–145)
TOTAL PROTEIN: 5.9 g/dL — AB (ref 6.5–8.1)
Total Bilirubin: 0.6 mg/dL (ref 0.3–1.2)

## 2018-09-18 NOTE — Progress Notes (Addendum)
      [address]       ,[place] [address]             [phone]      2 Days Post-Op Procedure(s) (LRB): VIDEO ASSISTED THORACOSCOPY (Left) STAPLING OF BLEBS (Left)   Subjective:  No new complaints.  Feeling better today.  Pain is controlled.  + BM  Objective: Vital signs in last 24 hours: Temp:  [97.7 F (36.5 C)-98.2 F (36.8 C)] 97.7 F (36.5 C) (10/17 0345) Pulse Rate:  [55-79] 55 (10/17 0345) Cardiac Rhythm: Normal sinus rhythm (10/17 0700) Resp:  [11-24] 19 (10/17 0407) BP: (106-124)/(61-73) 112/61 (10/17 0345) SpO2:  [98 %-100 %] 98 % (10/17 0407)  Intake/Output from previous day: 10/16 0701 - 10/17 0700 In: 1125.4 [P.O.:960; I.V.:165.4] Out: 529 [Urine:475; Chest Tube:54]  General appearance: alert, cooperative and no distress Heart: regular rate and rhythm Lungs: clear to auscultation bilaterally Abdomen: soft, non-tender; bowel sounds normal; no masses,  no organomegaly Extremities: extremities normal, atraumatic, no cyanosis or edema Wound: clean and dry  Lab Results: Recent Labs    09/17/18 0441 09/18/18 0304  WBC 8.6 5.6  HGB 13.9 13.4  HCT 42.8 42.3  PLT 208 186   BMET:  Recent Labs    09/17/18 0441 09/18/18 0304  NA 135 137  K 4.5 4.3  CL 103 102  CO2 26 28  GLUCOSE 143* 99  BUN 10 13  CREATININE 0.92 1.08  CALCIUM 9.4 9.4    PT/INR:  Recent Labs    09/16/18 0155  LABPROT 13.5  INR 1.04   ABG    Component Value Date/Time   PHART 7.408 09/17/2018 0410   HCO3 26.4 09/17/2018 0410   O2SAT 99.2 09/17/2018 0410   CBG (last 3)  Recent Labs    09/16/18 2356 09/17/18 0352 09/17/18 0759  GLUCAP 151* 127* 96    Assessment/Plan: S/P Procedure(s) (LRB): VIDEO ASSISTED THORACOSCOPY (Left) STAPLING OF BLEBS (Left)  1. CV- hemodynamically stable 2. Pulm- no acute issues, off oxygen, CXR without pneumothorax, improvement in left sided sub q air, no air leak on chest tube, can likely d/c today 3. Lovenox  for DVT prophylaxis 4. Dispo- patient stable, will discuss chest tube removal with Dr. Morton Peters, continue current care   LOS: 5 days    [place] 09/18/2018  DC chest tube Path shows benign blebs  patient examined and medical record reviewed,agree with above note. Kathlee Nations Trigt III 09/18/2018

## 2018-09-19 ENCOUNTER — Inpatient Hospital Stay (HOSPITAL_COMMUNITY): Payer: 59

## 2018-09-19 MED ORDER — IBUPROFEN 800 MG PO TABS: 800.0000 mg | ORAL_TABLET | Freq: Three times a day (TID) | ORAL | 0 refills | Status: DC | PRN

## 2018-09-19 MED ORDER — TRAMADOL HCL 50 MG PO TABS: 50.0000 mg | ORAL_TABLET | Freq: Four times a day (QID) | ORAL | 0 refills | Status: DC | PRN

## 2018-09-19 MED ORDER — ACETAMINOPHEN 500 MG PO TABS: 1000.0000 mg | ORAL_TABLET | Freq: Four times a day (QID) | ORAL | 0 refills | Status: DC | PRN

## 2018-09-19 NOTE — Progress Notes (Addendum)
      [address]       [place] [address]             [phone]      3 Days Post-Op Procedure(s) (LRB): VIDEO ASSISTED THORACOSCOPY (Left) STAPLING OF BLEBS (Left)   Subjective:  No new complaints.  Feels good, not using pain medication.  Ready to go home.  Objective: Vital signs in last 24 hours: Temp:  [98.1 F (36.7 C)-98.9 F (37.2 C)] 98.2 F (36.8 C) (10/18 0802) Pulse Rate:  [61-82] 61 (10/18 0415) Cardiac Rhythm: Normal sinus rhythm (10/18 0700) Resp:  [11-30] 19 (10/18 0802) BP: (102-121)/(62-94) 118/74 (10/18 0802) SpO2:  [96 %-100 %] 99 % (10/18 0415)  Intake/Output from previous day: 10/17 0701 - 10/18 0700 In: 439.4 [P.O.:240; I.V.:199.4] Out: 200 [Urine:200]  General appearance: alert, cooperative and no distress Heart: regular rate and rhythm Lungs: clear to auscultation bilaterally Abdomen: soft, non-tender; bowel sounds normal; no masses,  no organomegaly Extremities: extremities normal, atraumatic, no cyanosis or edema Wound: clean and dry  Lab Results: Recent Labs    09/17/18 0441 09/18/18 0304  WBC 8.6 5.6  HGB 13.9 13.4  HCT 42.8 42.3  PLT 208 186   BMET:  Recent Labs    09/17/18 0441 09/18/18 0304  NA 135 137  K 4.5 4.3  CL 103 102  CO2 26 28  GLUCOSE 143* 99  BUN 10 13  CREATININE 0.92 1.08  CALCIUM 9.4 9.4    PT/INR: No results for input(s): LABPROT, INR in the last 72 hours. ABG    Component Value Date/Time   PHART 7.408 09/17/2018 0410   HCO3 26.4 09/17/2018 0410   O2SAT 99.2 09/17/2018 0410   CBG (last 3)  Recent Labs    09/16/18 2356 09/17/18 0352 09/17/18 0759  GLUCAP 151* 127* 96    Assessment/Plan: S/P Procedure(s) (LRB): VIDEO ASSISTED THORACOSCOPY (Left) STAPLING OF BLEBS (Left)  1. CV- hemodynamically stable 2. Pulm- no acute issues, CXR is free from significant pneumothorax 3. Dispo- patient stable, will d/c home today   LOS: 6 days    Travis Morales 09/19/2018  cxr  good Home today Leave chestube suture patient examined and medical record reviewed,agree with above note. Travis Morales 09/19/2018

## 2018-09-19 NOTE — Discharge Instructions (Signed)
Discharge Instructions:  1. You may shower, please wash incisions daily with soap and water and keep dry.  If you wish to cover wounds with dressing you may do so but please keep clean and change daily.  No tub baths or swimming until incisions have completely healed.  If your incisions become red or develop any drainage please call our office at [phone]  2. No Driving until cleared by Dr. Zenaida Niece Trigt's office and you are no longer using narcotic pain medications  3. Fever of 101.5 for at least 24 hours with no source, please contact our office at [phone]  5. Activity- up as tolerated, please walk at least 3 times per day.  Avoid strenuous activity  6. If any questions or concerns arise, please do not hesitate to contact our office at [phone]

## 2018-09-19 NOTE — Plan of Care (Signed)
Problem: Clinical Measurements: Goal: Ability to maintain clinical measurements within normal limits will improve Outcome: Adequate for Discharge Goal: Will remain free from infection Outcome: Adequate for Discharge Goal: Diagnostic test results will improve Outcome: Adequate for Discharge Goal: Respiratory complications will improve Outcome: Adequate for Discharge Goal: Cardiovascular complication will be avoided Outcome: Adequate for Discharge   Problem: Activity: Goal: Risk for activity intolerance will decrease Outcome: Adequate for Discharge   Problem: Coping: Goal: Level of anxiety will decrease Outcome: Adequate for Discharge   Problem: Pain Managment: Goal: General experience of comfort will improve Outcome: Adequate for Discharge   Problem: Safety: Goal: Ability to remain free from injury will improve Outcome: Adequate for Discharge   Problem: Skin Integrity: Goal: Risk for impaired skin integrity will decrease Outcome: Adequate for Discharge  D/c home with family member. Discharge instructions given. NO further questions per pt.

## 2018-09-26 ENCOUNTER — Other Ambulatory Visit: Payer: Self-pay

## 2018-09-26 ENCOUNTER — Ambulatory Visit (INDEPENDENT_AMBULATORY_CARE_PROVIDER_SITE_OTHER): Payer: Self-pay

## 2018-09-26 DIAGNOSIS — Z4802 Encounter for removal of sutures: Secondary | ICD-10-CM

## 2018-09-26 NOTE — Progress Notes (Signed)
Patient arrived for nurse visit to remove 1  suture post- procedure VATS/ Bleb stapling.  Suture removed with no signs/ symptoms of infection noted.  Incision site cleaned with peroxide and incision opened.  See media for pictures.  Once opened the incision was superficial, not enough for packing. Patient tolerated procedure well.  Patient/ family instructed to keep the incision sites clean and dry and clean it with soap and water.  Patient was concerned about a rash on his left chest.  Barely visible upon inspection.  Picture also taken and in media.  He stated that he had numbness/tingling in that area as well.  I advised that this is normal, however, I would make Dr. Donata Clay aware.  Patient/ family acknowledged instructions given. Patient is requesting a return to work note at his visit with Dr. Donata Clay next week.

## 2018-09-29 ENCOUNTER — Telehealth: Payer: Self-pay

## 2018-09-29 ENCOUNTER — Encounter: Payer: Self-pay | Admitting: Cardiothoracic Surgery

## 2018-09-29 ENCOUNTER — Other Ambulatory Visit: Payer: Self-pay | Admitting: Cardiothoracic Surgery

## 2018-09-29 DIAGNOSIS — J939 Pneumothorax, unspecified: Secondary | ICD-10-CM

## 2018-09-29 NOTE — Telephone Encounter (Signed)
-----   Message from Kerin Perna, MD sent at 09/26/2018  7:10 PM EDT ----- Not sure which incision opened Needs to be packed with 2x2 saline wet/dry daily Show him how to do this monday in office ----- Message ----- From: Steve Rattler, RN Sent: 09/26/2018   9:38 AM EDT To: Kerin Perna, MD  Hey Dr. Donata Clay,  I just wanted you to take a look at the pictures I attached to this patient's visit.  He stated he had a rash.  Barely visible.  Also, his incision opened.  I cleaned it.  It did not look deep enough to pack.  Advised to clean with soap and water.  He has an appointment here next week with a chest xray.  Thanks,  Morrie Sheldon

## 2018-09-29 NOTE — Telephone Encounter (Signed)
Patient called and advised to continue cleaning area (chest tube incision site, left chest) with peroxide, soap, and water.  Area over the weekend looks to be healing well. Shallow opening.  Would be unable to pack at this point.  He has a follow-up appointment to see Dr. Donata Clay on Wednesday 10/01/2018 at 2:30 and can be re-evaluated if needed.  Will continue to monitor.

## 2018-10-01 ENCOUNTER — Other Ambulatory Visit: Payer: Self-pay

## 2018-10-01 ENCOUNTER — Ambulatory Visit
Admission: RE | Admit: 2018-10-01 | Discharge: 2018-10-01 | Disposition: A | Discharge status: Home / Self Care | Payer: 59 | Source: Ambulatory Visit | Attending: Cardiothoracic Surgery | Admitting: Cardiothoracic Surgery

## 2018-10-01 ENCOUNTER — Encounter: Payer: Self-pay | Admitting: Cardiothoracic Surgery

## 2018-10-01 ENCOUNTER — Ambulatory Visit (INDEPENDENT_AMBULATORY_CARE_PROVIDER_SITE_OTHER): Payer: Self-pay | Admitting: Cardiothoracic Surgery

## 2018-10-01 VITALS — BP 100/70 | HR 117 | Resp 18 | Ht 69.0 in | Wt 134.0 lb

## 2018-10-01 DIAGNOSIS — Z9889 Other specified postprocedural states: Secondary | ICD-10-CM

## 2018-10-01 DIAGNOSIS — J9383 Other pneumothorax: Secondary | ICD-10-CM

## 2018-10-01 DIAGNOSIS — J939 Pneumothorax, unspecified: Secondary | ICD-10-CM

## 2018-10-01 NOTE — Progress Notes (Signed)
PCP is Lindaann Pascal, PA-C Referring Provider is Cleora Fleet, MD  Chief Complaint  Patient presents with  . Follow-up    with chest xray, s/p VATS/ Bleb resection  . Spontaneous Pneumothorax    HPI: Scheduled postop follow-up after left VATS for resection of blebs and continues pneumothorax.  Patient doing well but with some postthoracotomy pain.  Chest x-ray today is clear.  No pneumothorax.  Surgical incisions are healing appropriately.  We discussed returning to work and lifting limitations and wound care. No narcotics prescribed for his mild postthoracotomy pain.  Past Medical History:  Diagnosis Date  . Migraines     Past Surgical History:  Procedure Laterality Date  . NO PAST SURGERIES    . STAPLING OF BLEBS Left 09/16/2018   Procedure: STAPLING OF BLEBS;  Surgeon: Kerin Perna, MD;  Location: [facility] OR;  Service: Thoracic;  Laterality: Left;  Marland Kitchen VIDEO ASSISTED THORACOSCOPY Left 09/16/2018   Procedure: VIDEO ASSISTED THORACOSCOPY;  Surgeon: Kerin Perna, MD;  Location: [facility] OR;  Service: Thoracic;  Laterality: Left;    Family History  Problem Relation Age of Onset  . Migraines Mother     Social History Social History   Tobacco Use  . Smoking status: Never Smoker  . Smokeless tobacco: Never Used  Substance Use Topics  . Alcohol use: No    Frequency: Never  . Drug use: No    Current Outpatient Medications  Medication Sig Dispense Refill  . acetaminophen (TYLENOL) 500 MG tablet Take 2 tablets (1,000 mg total) by mouth every 6 (six) hours as needed. 30 tablet 0  . ibuprofen (ADVIL,MOTRIN) 800 MG tablet Take 1 tablet (800 mg total) by mouth every 8 (eight) hours as needed for moderate pain. 30 tablet 0   No current facility-administered medications for this visit.     No Known Allergies  Review of Systems  Strength and appetite improving Patient anxious to return to work at light duty  BP 100/70 (BP Location: Right Arm, Patient Position: Sitting, Cuff  Size: Normal)   Pulse (!) 117   Resp 18   Ht 5\' 9"  (1.753 m)   Wt 134 lb (60.8 kg)   SpO2 98% Comment: RA  BMI 19.79 kg/m  Physical Exam      Exam    General- alert and comfortable    Neck- no JVD, no cervical adenopathy palpable, no carotid bruit   Lungs- clear without rales, wheezes   Cor- regular rate and rhythm, no murmur , gallop   Abdomen- soft, non-tender   Extremities - warm, non-tender, minimal edema   Neuro- oriented, appropriate, no focal weakness   Diagnostic Tests: Chest x-ray image today personally reviewed.  This shows no recurrent pneumothorax.  Impression: Excellent early recovery after left VATS for pneumothorax.  Plan: Return to work noticed provided patient for November 4.  Light duty and he should not lift more than 25 pounds until his next visit. Return in 3 to 4 weeks with chest x-ray for review of progress.  No prescriptions provided at this visit. Mikey Bussing, MD [facility] [phone]

## 2018-10-16 ENCOUNTER — Encounter: Payer: Self-pay | Admitting: Cardiothoracic Surgery

## 2018-10-21 ENCOUNTER — Other Ambulatory Visit: Payer: Self-pay | Admitting: Cardiothoracic Surgery

## 2018-10-21 DIAGNOSIS — J9383 Other pneumothorax: Secondary | ICD-10-CM

## 2018-10-22 ENCOUNTER — Ambulatory Visit (INDEPENDENT_AMBULATORY_CARE_PROVIDER_SITE_OTHER): Payer: Self-pay | Admitting: Cardiothoracic Surgery

## 2018-10-22 ENCOUNTER — Encounter: Payer: Self-pay | Admitting: Cardiothoracic Surgery

## 2018-10-22 ENCOUNTER — Ambulatory Visit
Admission: RE | Admit: 2018-10-22 | Discharge: 2018-10-22 | Disposition: A | Discharge status: Home / Self Care | Payer: 59 | Source: Ambulatory Visit | Attending: Cardiothoracic Surgery | Admitting: Cardiothoracic Surgery

## 2018-10-22 ENCOUNTER — Other Ambulatory Visit: Payer: Self-pay

## 2018-10-22 VITALS — BP 110/78 | HR 78 | Resp 16 | Ht 69.0 in | Wt 134.0 lb

## 2018-10-22 DIAGNOSIS — J9383 Other pneumothorax: Secondary | ICD-10-CM

## 2018-10-22 DIAGNOSIS — J439 Emphysema, unspecified: Secondary | ICD-10-CM

## 2018-10-22 DIAGNOSIS — J939 Pneumothorax, unspecified: Secondary | ICD-10-CM

## 2018-10-22 DIAGNOSIS — Z09 Encounter for follow-up examination after completed treatment for conditions other than malignant neoplasm: Secondary | ICD-10-CM

## 2018-10-22 NOTE — Progress Notes (Signed)
PCP is Lindaann PascalLong, Scott, PA-C Referring Provider is Cleora FleetJohnson, Clanford L, MD  Chief Complaint  Patient presents with  . Routine Post Op    3 wk f/u with a CXR s/p STAPLING OF BLEBS 09/17/18    HPI: Scheduled one month follow-up visit after left VATS for resection and stapling of blebs.  Patient has delayed healing of the chest tube site which is not completely healed.  Neosporin Band-Aid dressing is applied.  Today I applied some silver nitrate to cauterize the granulation tissue.  There is no depth to the wound just some skin separation.  The other VATS incisions are well-healed.  Chest x-ray today is clear. Patient is back to work with limitations.  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  Past Medical History:  Diagnosis Date  . Migraines     Past Surgical History:  Procedure Laterality Date  . NO PAST SURGERIES    . STAPLING OF BLEBS Left 09/16/2018   Procedure: STAPLING OF BLEBS;  Surgeon: Kerin PernaVan Trigt, Cortlyn Cannell, MD;  [facility] OR;  Service: Thoracic;  Laterality: Left;  Marland Kitchen. VIDEO ASSISTED THORACOSCOPY Left 09/16/2018   Procedure: VIDEO ASSISTED THORACOSCOPY;  Surgeon: Kerin PernaVan Trigt, Ayman Brull, MD;  [facility];  Service: Thoracic;  Laterality: Left;    Family History  Problem Relation Age of Onset  . Migraines Mother     Social History Social History   Tobacco Use  . Smoking status: Never Smoker  . Smokeless tobacco: Never Used  Substance Use Topics  . Alcohol use: No    Frequency: Never  . Drug use: No    Current Outpatient Medications  Medication Sig Dispense Refill  . acetaminophen (TYLENOL) 500 MG tablet Take 2 tablets (1,000 mg total) by mouth every 6 (six) hours as needed. 30 tablet 0  . ibuprofen (ADVIL,MOTRIN) 800 MG tablet Take 1 tablet (800 mg total) by mouth every 8 (eight) hours as needed for moderate pain. 30 tablet 0    No current facility-administered medications for this visit.     No Known Allergies  Review of Systems   Still having some expected post thoracotomy type pain, neuritic pain No shortness of breath or productive cough or fever  BP 110/78 (BP Location: Right Arm, Patient Position: Sitting, Cuff Size: Normal)   Pulse 78   Resp 16   Ht 5\' 9"  (1.753 m)   Wt 134 lb (60.8 kg)   SpO2 98% Comment: ON RA  BMI 19.79 kg/m  Physical Exam Alert and comfortable Breath sounds clear bilaterally Heart rate regular VATS incisions well-healed Chest tube site significant improved with only minimal skin separation and granulation tissue at this point treated with Band-Aid dressings  Diagnostic Tests: Chest x-ray image personally reviewed showing no pneumothorax  Impression: Good recovery after left VATS a month ago for recurrent spontaneous pneumothorax  Plan: Continue with activities as outlined above.  Return to work document revised So that he can do his job at lift no more than 50 pounds.  He will return in 4 weeks to review progress.  He is concerned about recurrent pneumothorax and I reassured him that the chance would be very small.  Mikey BussingPeter Van Trigt III, MD [facility] [phone]

## 2018-10-23 ENCOUNTER — Encounter: Payer: Self-pay | Admitting: Cardiothoracic Surgery

## 2018-11-12 ENCOUNTER — Ambulatory Visit: Payer: Self-pay | Admitting: Cardiothoracic Surgery

## 2018-12-24 ENCOUNTER — Ambulatory Visit: Payer: Self-pay | Admitting: Cardiothoracic Surgery

## 2018-12-30 ENCOUNTER — Ambulatory Visit: Payer: 59 | Admitting: Cardiothoracic Surgery

## 2018-12-31 ENCOUNTER — Encounter: Payer: Self-pay | Admitting: Cardiothoracic Surgery

## 2018-12-31 ENCOUNTER — Ambulatory Visit: Payer: Self-pay | Admitting: Cardiothoracic Surgery

## 2019-01-02 ENCOUNTER — Encounter: Payer: Self-pay | Admitting: Cardiothoracic Surgery

## 2019-01-08 ENCOUNTER — Ambulatory Visit: Payer: 59 | Admitting: Cardiothoracic Surgery

## 2019-01-08 ENCOUNTER — Other Ambulatory Visit: Payer: Self-pay

## 2019-01-08 ENCOUNTER — Encounter: Payer: Self-pay | Admitting: Cardiothoracic Surgery

## 2019-01-08 VITALS — BP 106/70 | HR 76 | Resp 16 | Ht 69.0 in | Wt 130.0 lb

## 2019-01-08 DIAGNOSIS — J439 Emphysema, unspecified: Secondary | ICD-10-CM | POA: Diagnosis not present

## 2019-01-08 DIAGNOSIS — J939 Pneumothorax, unspecified: Secondary | ICD-10-CM | POA: Diagnosis not present

## 2019-01-08 DIAGNOSIS — Z09 Encounter for follow-up examination after completed treatment for conditions other than malignant neoplasm: Secondary | ICD-10-CM

## 2019-01-08 NOTE — Progress Notes (Signed)
PCP is Lindaann Pascal, PA-C Referring Provider is Cleora Fleet, MD  Chief Complaint  Patient presents with  . Routine Post Op    4 wk f/u s/p L VATS/STAPLING OF BLEBS 09/16/18    HPI: Patient returns for final postop visit after left VATS, stapling of blebs for large spontaneous left pneumothorax with persistent air leak after chest tube placement.  The patient has done well.  The incisions are well-healed.  Last chest x-ray showed reexpansion of the left lung without pneumothorax, without pleural effusion.  The patient has returned to work but is working on a limited schedule.  He is has some mild post thoracotomy numbness in his left breast crease.  Past Medical History:  Diagnosis Date  . Migraines     Past Surgical History:  Procedure Laterality Date  . NO PAST SURGERIES    . STAPLING OF BLEBS Left 09/16/2018   Procedure: STAPLING OF BLEBS;  Surgeon: Kerin Perna, MD;  Location: [facility] OR;  Service: Thoracic;  Laterality: Left;  Marland Kitchen VIDEO ASSISTED THORACOSCOPY Left 09/16/2018   Procedure: VIDEO ASSISTED THORACOSCOPY;  Surgeon: Kerin Perna, MD;  Location: [facility] OR;  Service: Thoracic;  Laterality: Left;    Family History  Problem Relation Age of Onset  . Migraines Mother     Social History Social History   Tobacco Use  . Smoking status: Never Smoker  . Smokeless tobacco: Never Used  Substance Use Topics  . Alcohol use: No    Frequency: Never  . Drug use: No    Current Outpatient Medications  Medication Sig Dispense Refill  . acetaminophen (TYLENOL) 500 MG tablet Take 2 tablets (1,000 mg total) by mouth every 6 (six) hours as needed. 30 tablet 0  . ibuprofen (ADVIL,MOTRIN) 800 MG tablet Take 1 tablet (800 mg total) by mouth every 8 (eight) hours as needed for moderate pain. 30 tablet 0   No current facility-administered medications for this visit.     No Known Allergies  Review of Systems   No shortness of breath , Difficulty doing his job duties at work  following the weight restriction less than 50 pounds No significant incisional pain  BP 106/70 (BP Location: Left Arm, Patient Position: Sitting, Cuff Size: Large)   Pulse 76   Resp 16   Ht 5\' 9"  (1.753 m)   Wt 130 lb (59 kg)   SpO2 98% Comment: ON RA  BMI 19.20 kg/m  Physical Exam      Exam    General- alert and comfortable    Neck- no JVD, no cervical adenopathy palpable, no carotid bruit   Lungs- clear without rales, wheezes.  VATS incisions are well-healed.   Cor- regular rate and rhythm, no murmur , gallop   Abdomen- soft, non-tender   Extremities - warm, non-tender, minimal edema   Neuro- oriented, appropriate, no focal weakness   Diagnostic Tests: None  Impression: Patient has reached full recovery after left VATS for stapling of blebs following spontaneous pneumothorax.  Plan: Return to work without restrictions.  Note provided patient. Return as needed.  Mikey Bussing, MD [facility] [phone]

## 2019-09-13 IMAGING — CR DG CHEST 1V PORT
1 series · 1 of 1 positions shown · non-contrast
Comparison: Film from earlier in the same day.

CLINICAL DATA: Follow-up pneumothorax

EXAM:
PORTABLE CHEST 1 VIEW

[portable]
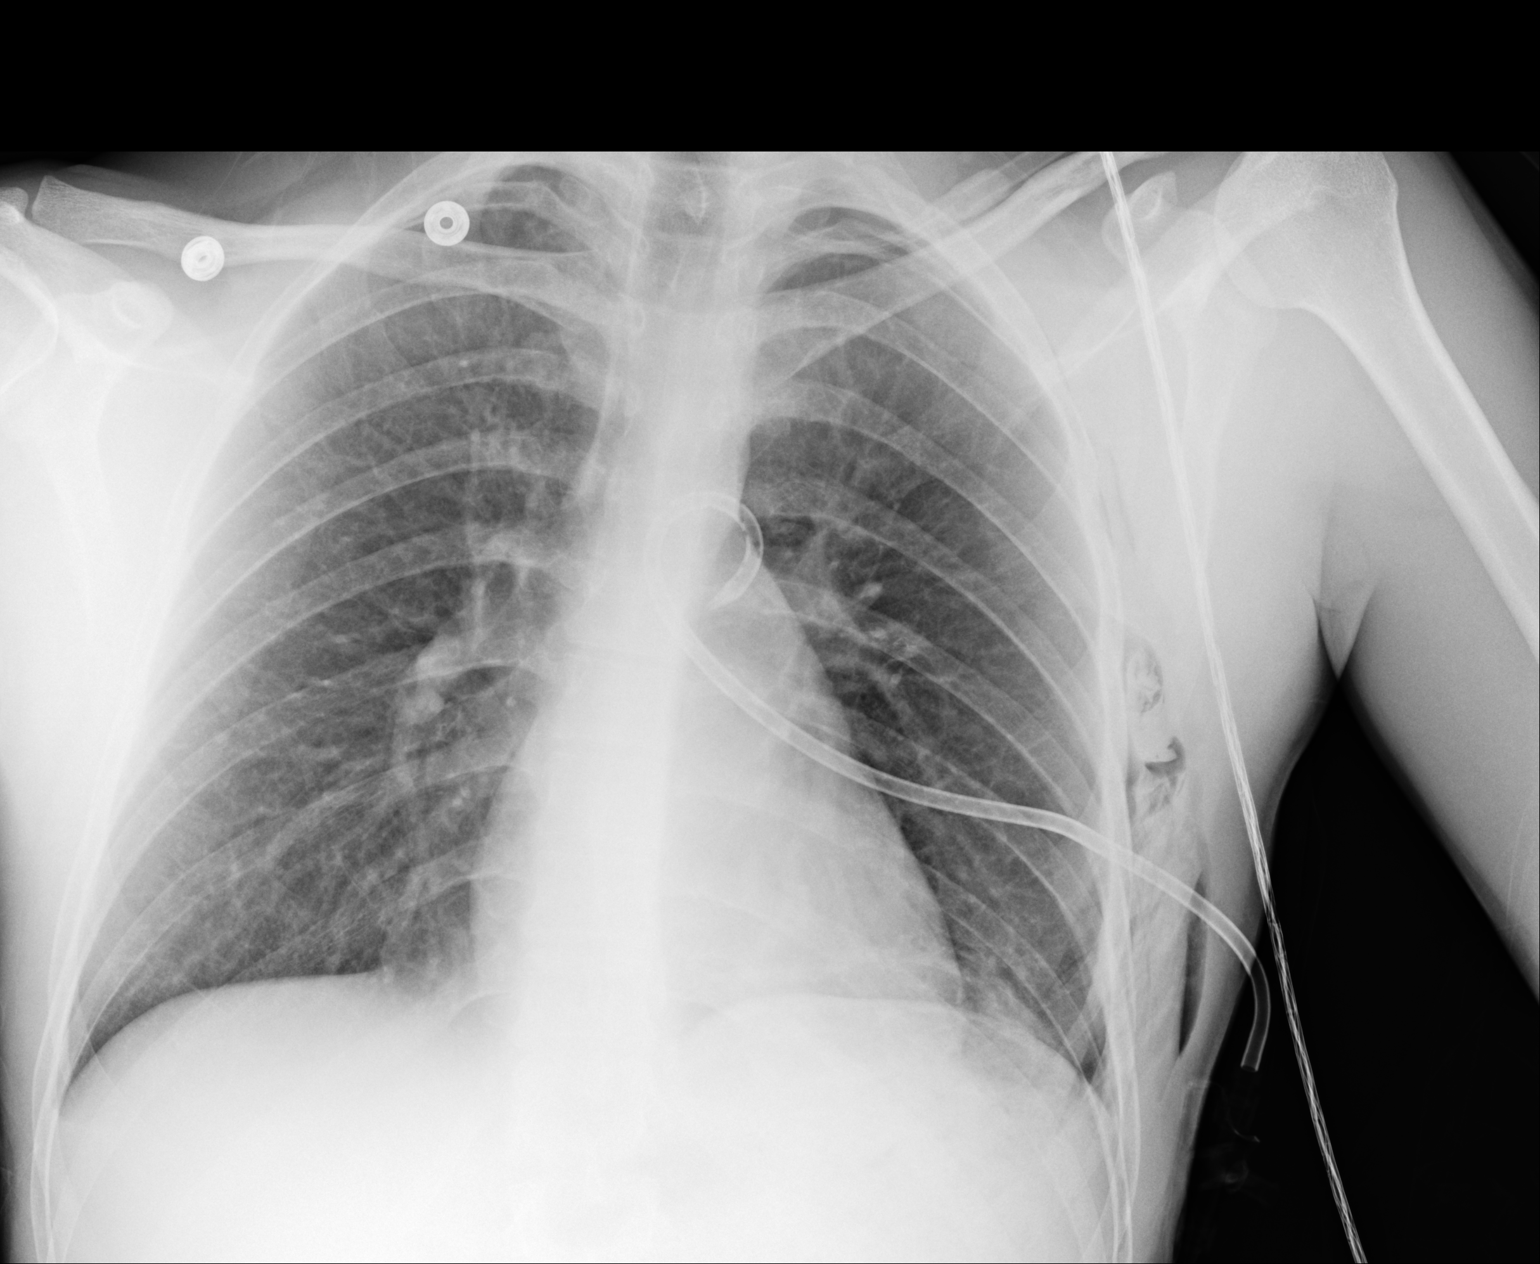

[1 of 1 positions shown; findings below may reference images not displayed]

FINDINGS: Left-sided chest tube is again identified. No recurrent pneumothorax
is seen. Subcutaneous emphysema is again identified on the left. The
right lung remains clear. Cardiac shadow is stable.
IMPRESSION: No recurrent pneumothorax identified.

## 2019-09-16 IMAGING — DX DG CHEST 1V PORT
1 series · 1 of 1 positions shown · non-contrast
Comparison: CT scan of same day.  Radiograph of September 15, 2018.

CLINICAL DATA: Pneumothorax.

EXAM:
PORTABLE CHEST 1 VIEW

[chest ap]
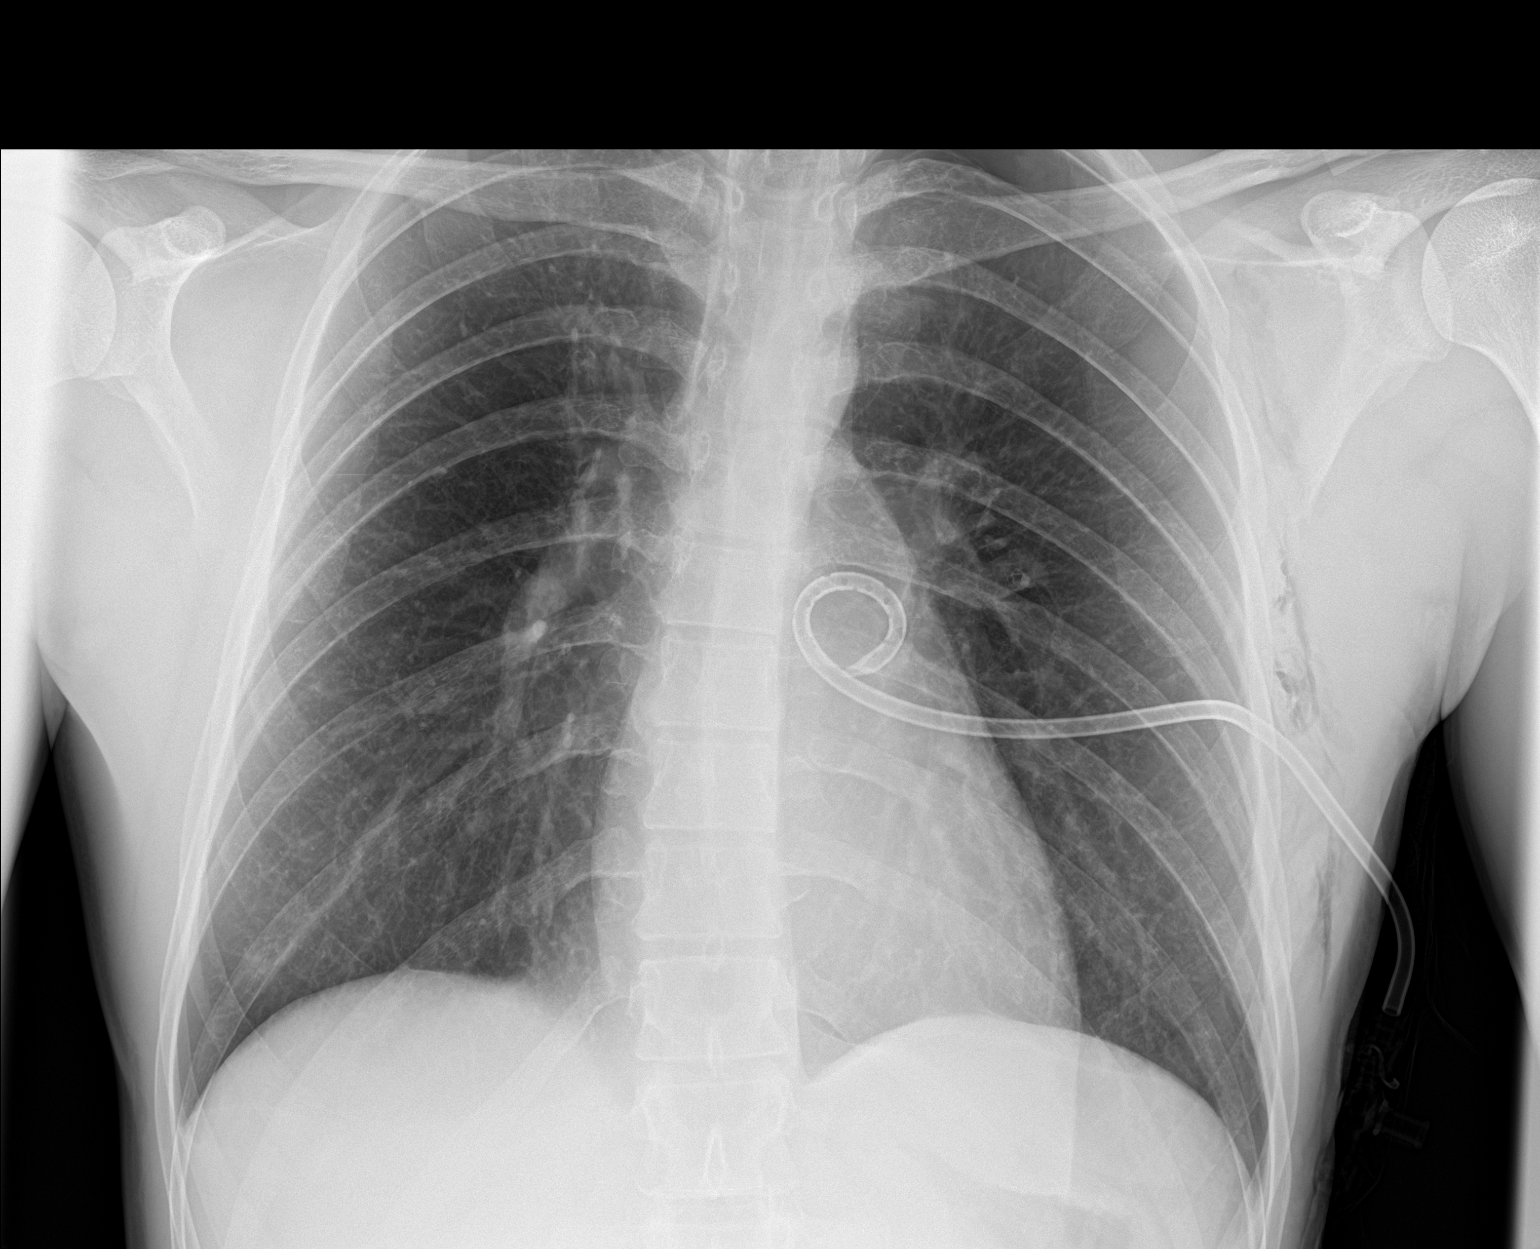

[1 of 1 positions shown; findings below may reference images not displayed]

FINDINGS: The heart size and mediastinal contours are within normal limits.
Right lung is clear. Stable position of left-sided chest tube with
mild amount of subcutaneous emphysema over left lateral chest wall.
Stable left apical pneumothorax compared to prior exam. No
consolidative process is noted.. The visualized skeletal structures
are unremarkable.
IMPRESSION: Stable position of left-sided chest tube with stable left apical
pneumothorax.

## 2019-09-16 IMAGING — DX DG CHEST 1V PORT
1 series · 1 of 1 positions shown · non-contrast
Comparison: Portable exam 8188 hours compared to 3573 hours

CLINICAL DATA: Shortness of breath and some RIGHT side chest pain
post VATS surgery

EXAM:
PORTABLE CHEST 1 VIEW

[chest ap]
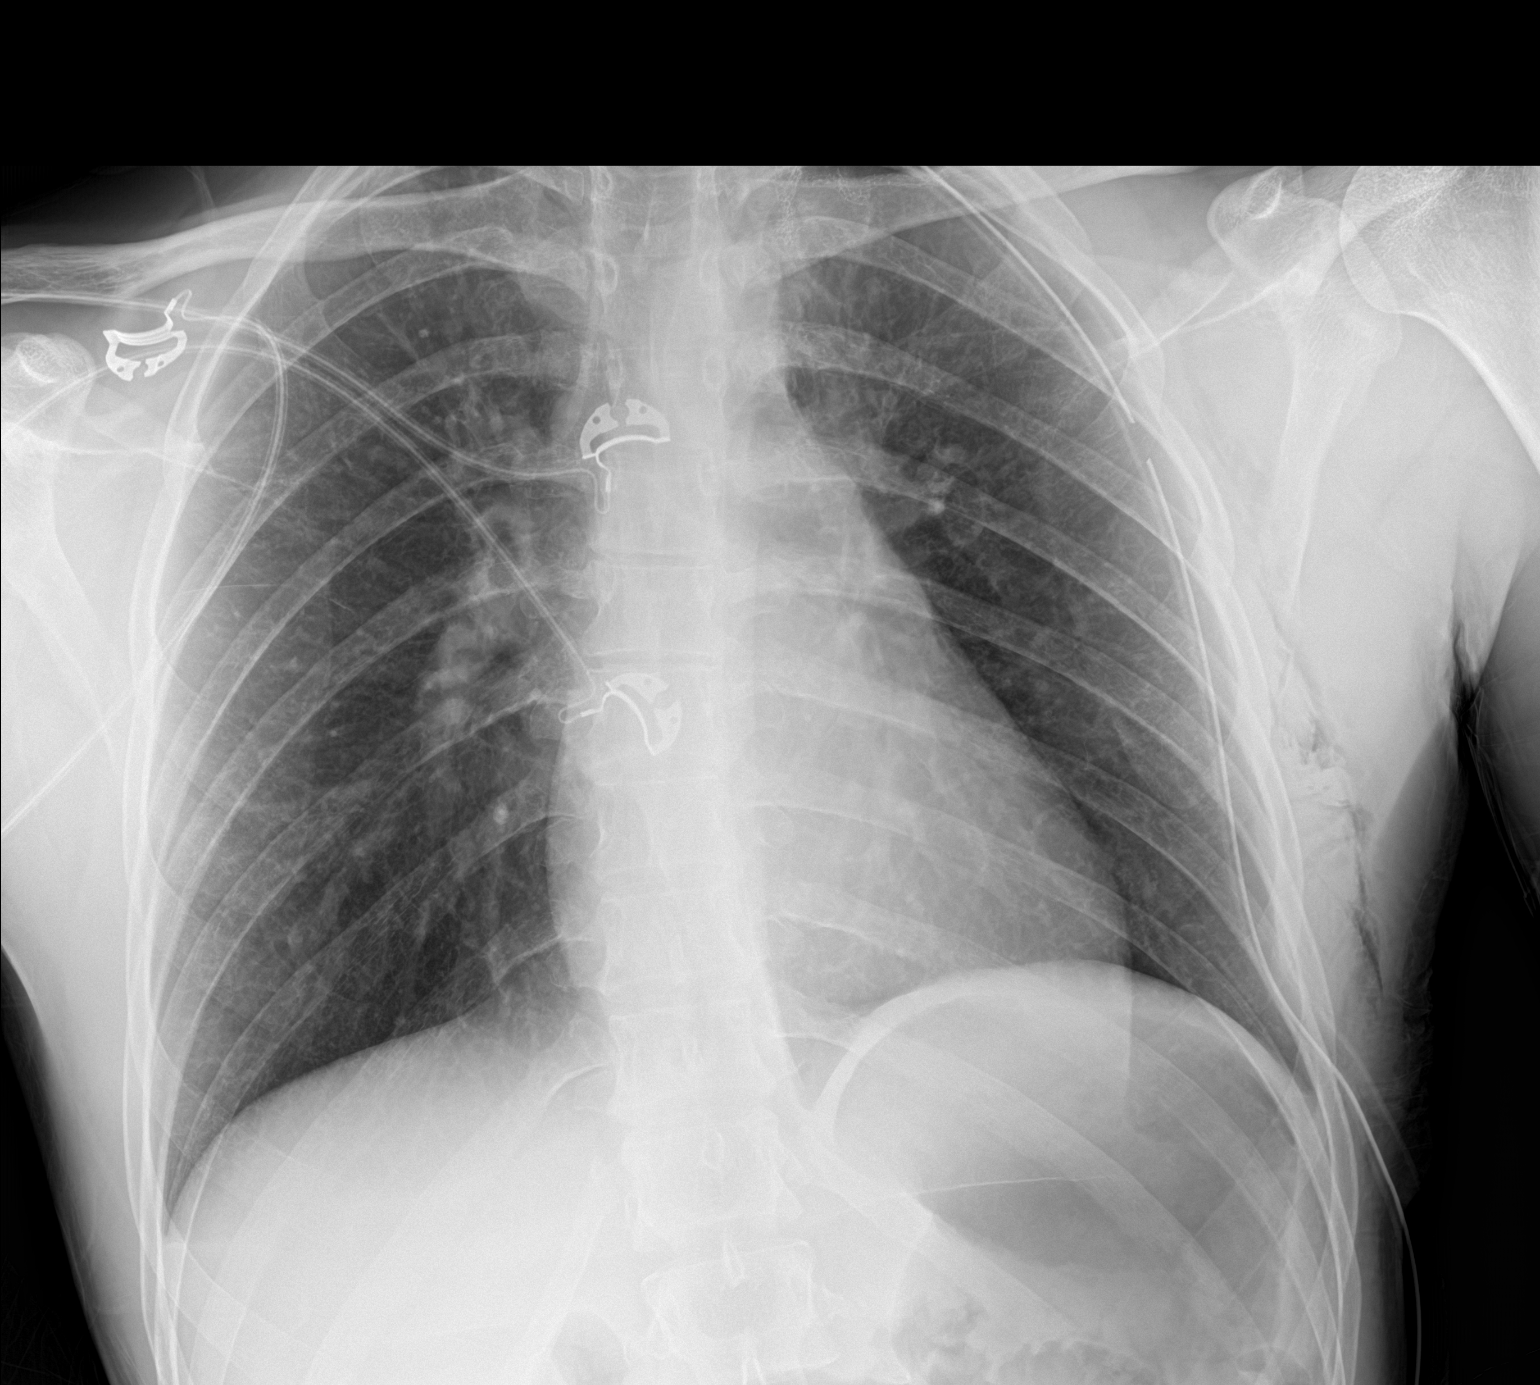

[1 of 1 positions shown; findings below may reference images not displayed]

FINDINGS: Interval removal of pigtail LEFT thoracostomy tube and placement of
a straight thoracostomy tube to LEFT apex.

Staple lines of LEFT apex from interval upper lobe resection.

Normal heart size, mediastinal contours, and pulmonary vascularity.

Lungs clear.

No acute infiltrate, pleural effusion or pneumothorax.

Mild gaseous distention of stomach.
IMPRESSION: Post LEFT upper lobe resection with LEFT thoracostomy tube.

No definite LEFT pneumothorax.

## 2019-09-19 IMAGING — DX DG CHEST 2V
2 series · 2 of 2 positions shown · non-contrast
Comparison: 09/18/2018

CLINICAL DATA: Status post left chest tube removal.

EXAM:
CHEST - 2 VIEW

[chest pa]
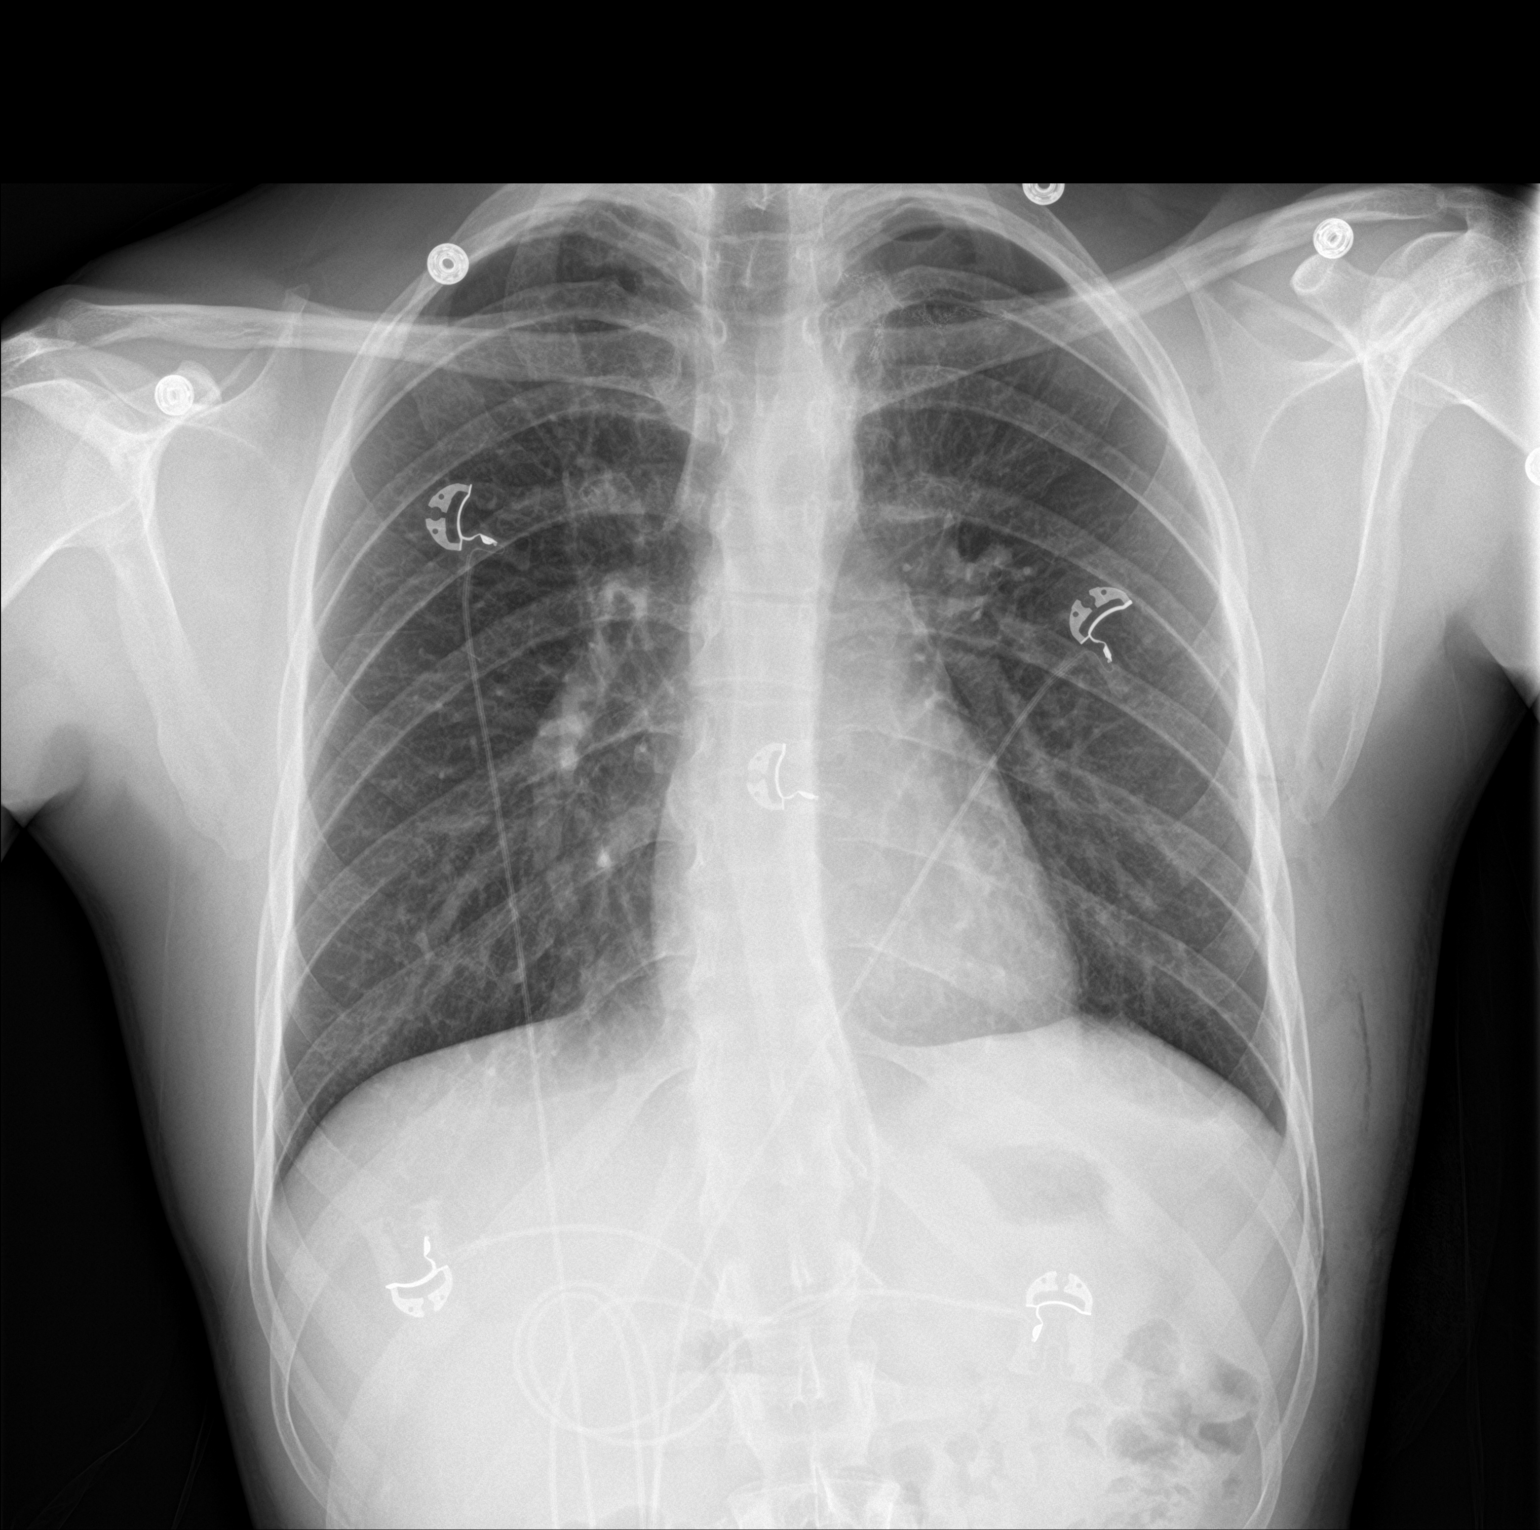

[chest lat]
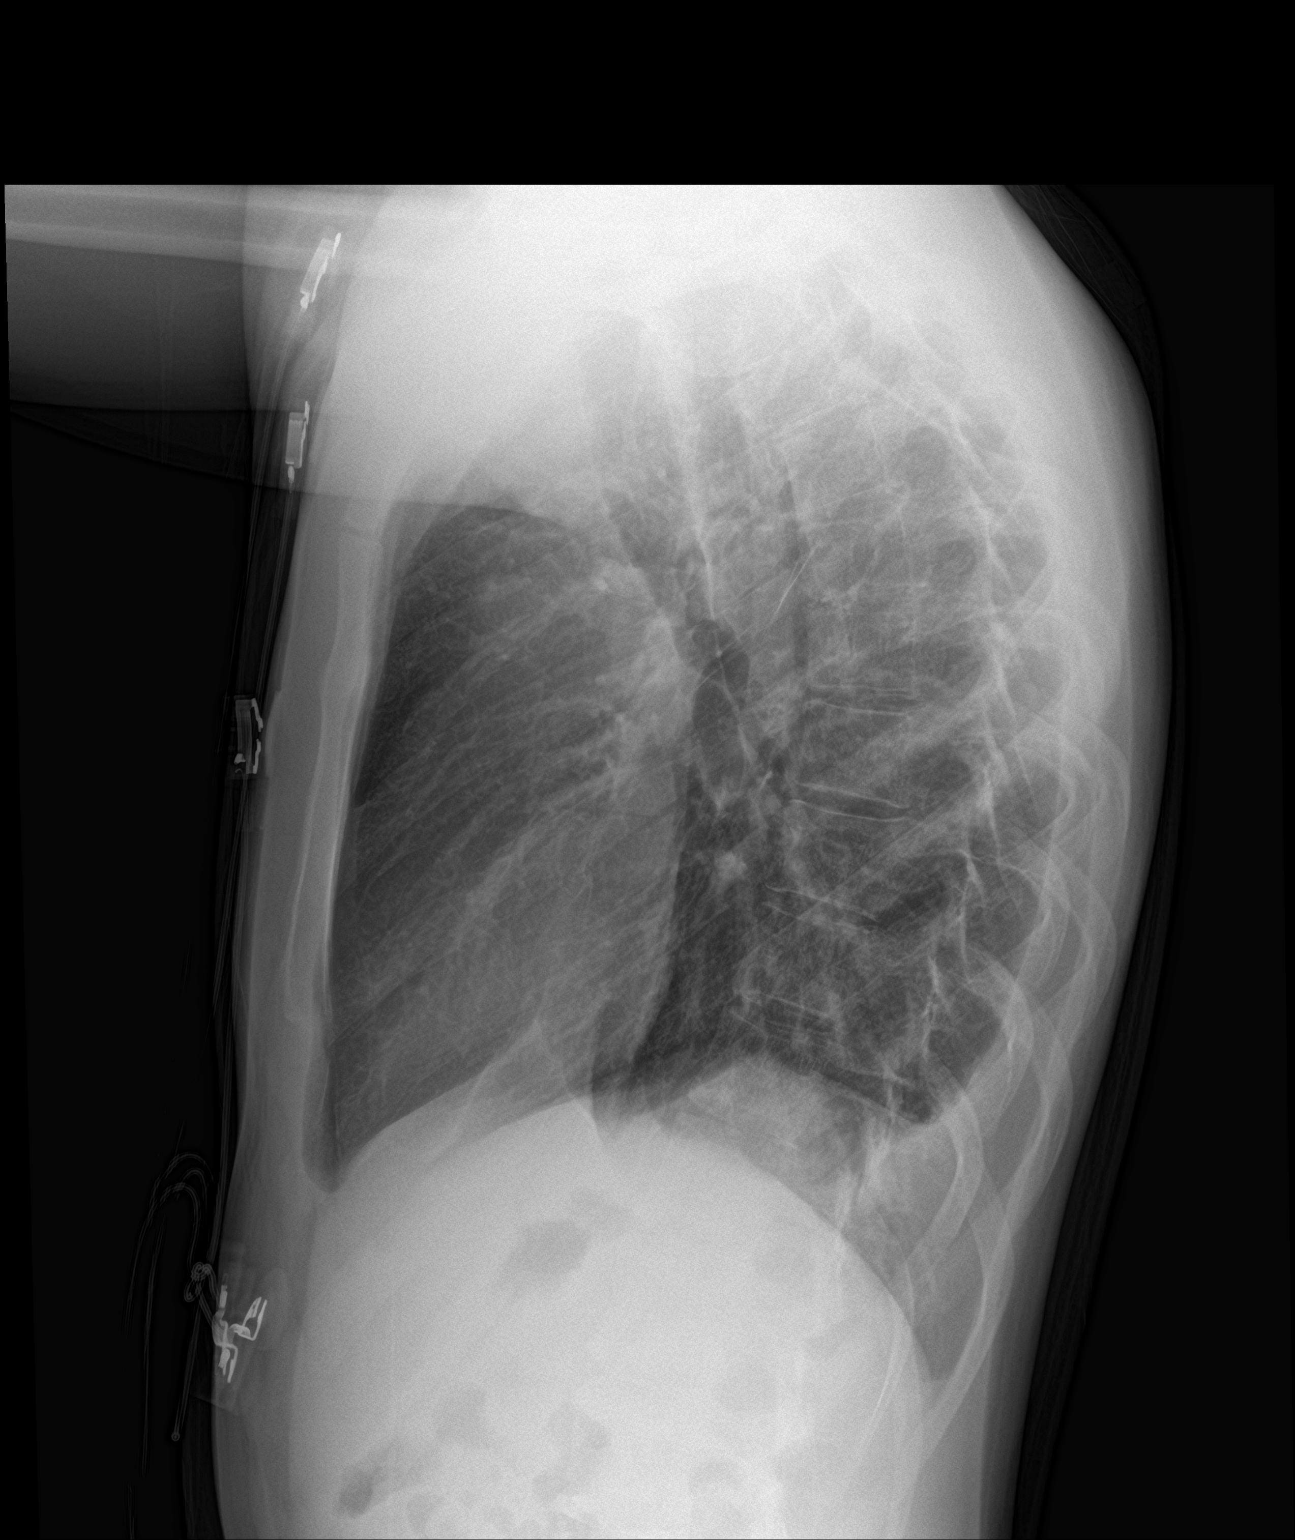

[2 of 2 positions shown; findings below may reference images not displayed]

FINDINGS: The cardiomediastinal silhouette is within normal limits.
Postsurgical changes are again seen in the left lung apex with
evidence of left lung volume loss. The left chest tube has been
removed, and there is a trace left apical pneumothorax. No airspace
consolidation or edema is identified. Blunting of the left
costophrenic angle posteriorly is likely at least in part due to
postsurgical volume loss though a trace pleural effusion is
possible. A small amount of soft tissue gas is noted in the left
chest wall.
IMPRESSION: Trace left apical pneumothorax following left chest tube removal.

## 2019-12-15 ENCOUNTER — Telehealth: Payer: Self-pay

## 2019-12-15 NOTE — Telephone Encounter (Signed)
Have him come in for CXR wed 1-13 - add on for 15 min visit PVT

## 2019-12-15 NOTE — Telephone Encounter (Signed)
Pt calls office to report sudden onset of sharp pain to L upper back and chest this morning, similar to what he experienced when he developed a L spontaneous pneumothorax in 09/2018. Pt states he has moderate sob w/ exertion. Reports that a nurse at work listened to his lungs and stated, "it doesn't sound good." Advised to call PCP immediately and/or go to the ED to be evaluated.

## 2019-12-22 ENCOUNTER — Other Ambulatory Visit: Payer: Self-pay | Admitting: *Deleted

## 2019-12-22 DIAGNOSIS — J9383 Other pneumothorax: Secondary | ICD-10-CM

## 2019-12-23 ENCOUNTER — Ambulatory Visit
Admission: RE | Admit: 2019-12-23 | Discharge: 2019-12-23 | Disposition: A | Discharge status: Home / Self Care | Payer: 59 | Source: Ambulatory Visit | Attending: Cardiothoracic Surgery | Admitting: Cardiothoracic Surgery

## 2019-12-23 ENCOUNTER — Encounter: Payer: Self-pay | Admitting: Cardiothoracic Surgery

## 2019-12-23 ENCOUNTER — Other Ambulatory Visit: Payer: Self-pay

## 2019-12-23 ENCOUNTER — Ambulatory Visit: Payer: 59 | Admitting: Cardiothoracic Surgery

## 2019-12-23 VITALS — BP 110/73 | HR 78 | Temp 97.9°F | Resp 16 | Ht 69.0 in | Wt 130.0 lb

## 2019-12-23 DIAGNOSIS — Z09 Encounter for follow-up examination after completed treatment for conditions other than malignant neoplasm: Secondary | ICD-10-CM

## 2019-12-23 DIAGNOSIS — J439 Emphysema, unspecified: Secondary | ICD-10-CM

## 2019-12-23 DIAGNOSIS — J939 Pneumothorax, unspecified: Secondary | ICD-10-CM | POA: Diagnosis not present

## 2019-12-23 DIAGNOSIS — J9383 Other pneumothorax: Secondary | ICD-10-CM

## 2019-12-23 NOTE — Progress Notes (Signed)
PCP is Lindaann Pascal, PA-C Referring Provider is Cleora Fleet, MD  Chief Complaint  Patient presents with  . Routine Post Op    c/o right sided upper chest discomfort.Marland KitchenMarland KitchenCXR .Marland KitchenMarland KitchenR/O PTX    HPI: Patient examined, images of today's chest x-ray personally reviewed and discussed with patient. Patient is a thin 24 year old male who had a left VATS for resection of bleb and spontaneous pneumothorax in 2019.  Last week at his job he developed some left anterior pleuritic pain and shortness of breath.  He was evaluated by his local physician where chest x-ray reportedly showed no pneumothorax.  He had some blood work done as well, results not available.  He was given naproxen anti-inflammatory for a week and this has improved his symptoms.  He denies any trauma.  He is not exposed to smoke.  He denies fever.  He denies productive cough, loss of taste or smell, or dizziness or headache.  Today AP and lateral chest x-ray shows no pneumothorax no pleural effusions no interstitial disease or infiltrate. Past Medical History:  Diagnosis Date  . Migraines     Past Surgical History:  Procedure Laterality Date  . NO PAST SURGERIES    . STAPLING OF BLEBS Left 09/16/2018   Procedure: STAPLING OF BLEBS;  Surgeon: Kerin Perna, MD;  Location: [facility] OR;  Service: Thoracic;  Laterality: Left;  Marland Kitchen VIDEO ASSISTED THORACOSCOPY Left 09/16/2018   Procedure: VIDEO ASSISTED THORACOSCOPY;  Surgeon: Kerin Perna, MD;  Location: [facility] OR;  Service: Thoracic;  Laterality: Left;    Family History  Problem Relation Age of Onset  . Migraines Mother     Social History Social History   Tobacco Use  . Smoking status: Never Smoker  . Smokeless tobacco: Never Used  Substance Use Topics  . Alcohol use: No  . Drug use: No    No current outpatient medications on file.   No current facility-administered medications for this visit.    No Known Allergies  Review of Systems   No fever No headache No loss  of appetite No edema No presyncope or dizziness  BP 110/73 (BP Location: Left Arm, Patient Position: Sitting, Cuff Size: Normal)   Pulse 78   Temp 97.9 F (36.6 C)   Resp 16   Ht 5\' 9"  (1.753 m)   Wt 130 lb (59 kg)   SpO2 95% Comment: RA  BMI 19.20 kg/m  Physical Exam Alert and comfortable Neck without crepitus JVD or adenopathy Lungs clear bilaterally Well-healed left VATS incision Heart rate regular without murmur  Diagnostic Tests: Chest x-ray today is clear, no pneumothorax  Impression: Patient probably with pleurisy related inflammation.  He is responding to Naprosyn.  Plan: He can return to work.  If his symptoms do not resolve he will let me know and he can be given a short prednisone taper.  No further chest x-rays needed.   , MD [facility] [phone]

## 2021-09-22 ENCOUNTER — Inpatient Hospital Stay (HOSPITAL_COMMUNITY): Payer: 59 | Attending: Hematology | Admitting: Hematology

## 2021-09-22 DIAGNOSIS — D72810 Lymphocytopenia: Secondary | ICD-10-CM | POA: Insufficient documentation
